# Patient Record
Sex: Female | Born: 1955 | Race: White | Hispanic: No | Marital: Married | Smoking: Never smoker
Health system: Southern US, Community
[De-identification: ages and names within clinical notes are randomized; demographics above are authoritative.]

## PROBLEM LIST (undated history)

## (undated) DIAGNOSIS — E039 Hypothyroidism, unspecified: Secondary | ICD-10-CM

## (undated) DIAGNOSIS — E538 Deficiency of other specified B group vitamins: Secondary | ICD-10-CM

## (undated) DIAGNOSIS — Z8719 Personal history of other diseases of the digestive system: Secondary | ICD-10-CM

## (undated) DIAGNOSIS — R430 Anosmia: Secondary | ICD-10-CM

## (undated) DIAGNOSIS — R519 Headache, unspecified: Secondary | ICD-10-CM

## (undated) DIAGNOSIS — I493 Ventricular premature depolarization: Secondary | ICD-10-CM

## (undated) DIAGNOSIS — R51 Headache: Secondary | ICD-10-CM

## (undated) HISTORY — DX: Ventricular premature depolarization: I49.3

## (undated) HISTORY — PX: TONSILLECTOMY: SUR1361

## (undated) HISTORY — DX: Deficiency of other specified B group vitamins: E53.8

## (undated) HISTORY — DX: Anosmia: R43.0

## (undated) HISTORY — PX: BREAST BIOPSY: SHX20

## (undated) HISTORY — PX: COLONOSCOPY: SHX174

## (undated) HISTORY — PX: BREAST CYST ASPIRATION: SHX578

---

## 1997-08-21 ENCOUNTER — Ambulatory Visit (HOSPITAL_COMMUNITY): Admission: RE | Admit: 1997-08-21 | Discharge: 1997-08-21 | Payer: Self-pay | Admitting: Obstetrics and Gynecology

## 1997-09-03 ENCOUNTER — Ambulatory Visit (HOSPITAL_COMMUNITY): Admission: RE | Admit: 1997-09-03 | Discharge: 1997-09-03 | Payer: Self-pay | Admitting: Obstetrics and Gynecology

## 1999-03-31 ENCOUNTER — Other Ambulatory Visit: Admission: RE | Admit: 1999-03-31 | Discharge: 1999-03-31 | Payer: Self-pay | Admitting: Obstetrics and Gynecology

## 2000-05-31 ENCOUNTER — Other Ambulatory Visit: Admission: RE | Admit: 2000-05-31 | Discharge: 2000-05-31 | Payer: Self-pay | Admitting: Obstetrics and Gynecology

## 2003-01-30 ENCOUNTER — Ambulatory Visit (HOSPITAL_COMMUNITY): Admission: RE | Admit: 2003-01-30 | Discharge: 2003-01-30 | Payer: Self-pay | Admitting: Gastroenterology

## 2003-05-12 ENCOUNTER — Other Ambulatory Visit: Admission: RE | Admit: 2003-05-12 | Discharge: 2003-05-12 | Payer: Self-pay | Admitting: Obstetrics and Gynecology

## 2004-10-28 ENCOUNTER — Encounter: Admission: RE | Admit: 2004-10-28 | Discharge: 2004-10-28 | Payer: Self-pay | Admitting: Obstetrics and Gynecology

## 2004-11-10 ENCOUNTER — Other Ambulatory Visit: Admission: RE | Admit: 2004-11-10 | Discharge: 2004-11-10 | Payer: Self-pay | Admitting: Obstetrics and Gynecology

## 2004-11-11 ENCOUNTER — Ambulatory Visit: Payer: Self-pay | Admitting: Hematology & Oncology

## 2005-01-25 ENCOUNTER — Ambulatory Visit: Payer: Self-pay | Admitting: Hematology & Oncology

## 2005-10-04 ENCOUNTER — Encounter: Admission: RE | Admit: 2005-10-04 | Discharge: 2005-11-25 | Payer: Self-pay | Admitting: Family Medicine

## 2007-01-25 ENCOUNTER — Ambulatory Visit (HOSPITAL_BASED_OUTPATIENT_CLINIC_OR_DEPARTMENT_OTHER): Admission: RE | Admit: 2007-01-25 | Discharge: 2007-01-25 | Payer: Self-pay | Admitting: Orthopedic Surgery

## 2007-03-06 ENCOUNTER — Encounter: Admission: RE | Admit: 2007-03-06 | Discharge: 2007-03-06 | Payer: Self-pay | Admitting: Obstetrics and Gynecology

## 2007-11-12 ENCOUNTER — Encounter: Admission: RE | Admit: 2007-11-12 | Discharge: 2007-11-12 | Payer: Self-pay | Admitting: Obstetrics and Gynecology

## 2007-11-15 ENCOUNTER — Encounter: Admission: RE | Admit: 2007-11-15 | Discharge: 2007-11-15 | Payer: Self-pay | Admitting: Obstetrics and Gynecology

## 2007-11-15 ENCOUNTER — Encounter (INDEPENDENT_AMBULATORY_CARE_PROVIDER_SITE_OTHER): Payer: Self-pay | Admitting: Diagnostic Radiology

## 2008-05-12 ENCOUNTER — Encounter: Admission: RE | Admit: 2008-05-12 | Discharge: 2008-05-12 | Payer: Self-pay | Admitting: Obstetrics and Gynecology

## 2009-09-30 ENCOUNTER — Encounter: Admission: RE | Admit: 2009-09-30 | Discharge: 2009-09-30 | Payer: Self-pay | Admitting: Obstetrics and Gynecology

## 2010-09-01 ENCOUNTER — Other Ambulatory Visit: Payer: Self-pay | Admitting: Dermatology

## 2010-11-30 NOTE — Op Note (Signed)
NAME:  Sherri Villarreal, Sherri Villarreal              ACCOUNT NO.:  [id number]   MEDICAL RECORD NO.:  [id number]          PATIENT TYPE:  AMB   LOCATION:  DSC                          FACILITY:  [facility]   PHYSICIAN:  Rodney A. Mortenson, M.D.DATE OF BIRTH:  03/17/1956   DATE OF PROCEDURE:  01/25/2007  DATE OF DISCHARGE:                               OPERATIVE REPORT   PREOPERATIVE DIAGNOSIS:  Pyarthrosis PIP joint left long finger.   POSTOPERATIVE DIAGNOSIS:  Pyarthrosis PIP joint left long finger.   OPERATION:  Arthrotomy PIP joint left long finger, irrigation and  drainage, and cultures PIP joint, left long finger.   SURGEON:  Lenard Galloway. Chaney Malling, M.D.   ANESTHESIA:  MAC.   PROCEDURE:  The patient was placed on the operating table in a supine  position.  A pneumatic tourniquet was placed about the left arm.  The  entire left upper extremity was prepped with DuraPrep and draped out in  the usual manner.  A digital nerve block was done as MAC anesthesia was  given.  The hand and arm was wrapped out and the Esmarch tourniquet was  elevated.  There was marked swelling about the PIP joint to the long  finger.  An incision was made over the dorsal radial side of the PIP  joint for about 8-9 mm.  The synovium was bulging out into the wound.  The dorsal capsule synovium was incised and cloudy fluid was seen in the  joint.  Aerobic and anaerobic cultures were taken.  Expression of the  PIP joint produced further yellow cloudy fluid.  The joint was then  irrigated with copious amounts of saline solution.  A Penrose type drain  was placed in the joint.  The wound was left open.  Sterile dressings  were applied.  The patient returned to the recovery room in excellent  condition.  The patient was placed on IV Keflex after cultures were  obtained.  Technically, this procedure went extremely well.   FOLLOW-UP CARE:  1. To my office on Monday.  2. I will change the dressing and pull the Penrose on Monday.  3.  Keflex 500 mg q.i.d. pending culture results.           ______________________________  Lenard Galloway Chaney Malling, M.D.     RAM/MEDQ  D:  01/25/2007  T:  01/26/2007  Job:  [id number]

## 2010-12-03 NOTE — Op Note (Signed)
   NAME:  Sherri Villarreal, Sherri Villarreal                        ACCOUNT NO.:  [id number]   MEDICAL RECORD NO.:  [id number]                   PATIENT TYPE:  AMB   LOCATION:  ENDO                                 FACILITY:  [facility]   PHYSICIAN:  Graylin Shiver, M.D.                DATE OF BIRTH:  04/03/1956   DATE OF PROCEDURE:  01/30/2003  DATE OF DISCHARGE:                                 OPERATIVE REPORT   PROCEDURE:  Colonoscopy.   INDICATION FOR PROCEDURE:  Iron-deficiency anemia, family history of colon  polyps.   Informed consent was obtained after explanation of risks of bleeding,  infection, and perforation.   PREMEDICATION:  Fentanyl 100 mcg IV, Versed 7 mg IV.   PROCEDURE:  With the patient in the left lateral decubitus position, a  rectal exam was performed and no masses were felt.  The Olympus colonoscope  was inserted into the rectum and advanced around the colon to the cecum.  Cecal landmarks were identified.  The cecum and ascending colon were normal.  The transverse colon was normal.  The descending colon and sigmoid and  rectum were normal.  She tolerated the procedure well without complications.   IMPRESSION:  Normal colonoscopy to the cecum.                                               Graylin Shiver, M.D.    SFG/MEDQ  D:  01/30/2003  T:  01/30/2003  Job:  [id number]   cc:   Deboraha Sprang at [facility], FNP

## 2011-05-03 LAB — POCT HEMOGLOBIN-HEMACUE: Hemoglobin: 13.4

## 2011-05-03 LAB — CULTURE, ROUTINE-ABSCESS: Culture: NO GROWTH

## 2011-05-03 LAB — ANAEROBIC CULTURE

## 2011-10-04 ENCOUNTER — Other Ambulatory Visit: Payer: Self-pay | Admitting: Dermatology

## 2012-06-04 ENCOUNTER — Other Ambulatory Visit: Payer: Self-pay | Admitting: Obstetrics and Gynecology

## 2012-06-12 ENCOUNTER — Other Ambulatory Visit: Payer: Self-pay | Admitting: Obstetrics and Gynecology

## 2012-06-12 DIAGNOSIS — R928 Other abnormal and inconclusive findings on diagnostic imaging of breast: Secondary | ICD-10-CM

## 2012-06-20 ENCOUNTER — Ambulatory Visit
Admission: RE | Admit: 2012-06-20 | Discharge: 2012-06-20 | Disposition: A | Discharge status: Home / Self Care | Payer: 59 | Source: Ambulatory Visit | Attending: Obstetrics and Gynecology | Admitting: Obstetrics and Gynecology

## 2012-06-20 DIAGNOSIS — R928 Other abnormal and inconclusive findings on diagnostic imaging of breast: Secondary | ICD-10-CM

## 2013-03-06 ENCOUNTER — Other Ambulatory Visit: Payer: Self-pay | Admitting: Dermatology

## 2013-08-29 ENCOUNTER — Other Ambulatory Visit: Payer: Self-pay

## 2013-08-29 DIAGNOSIS — Z1231 Encounter for screening mammogram for malignant neoplasm of breast: Secondary | ICD-10-CM

## 2013-09-05 ENCOUNTER — Other Ambulatory Visit: Payer: Self-pay | Admitting: Obstetrics and Gynecology

## 2013-09-16 ENCOUNTER — Ambulatory Visit
Admission: RE | Admit: 2013-09-16 | Discharge: 2013-09-16 | Disposition: A | Discharge status: Home / Self Care | Payer: 59 | Source: Ambulatory Visit

## 2013-09-16 DIAGNOSIS — Z1231 Encounter for screening mammogram for malignant neoplasm of breast: Secondary | ICD-10-CM

## 2013-09-17 ENCOUNTER — Other Ambulatory Visit: Payer: Self-pay | Admitting: Obstetrics and Gynecology

## 2013-09-17 DIAGNOSIS — R928 Other abnormal and inconclusive findings on diagnostic imaging of breast: Secondary | ICD-10-CM

## 2013-09-20 ENCOUNTER — Ambulatory Visit
Admission: RE | Admit: 2013-09-20 | Discharge: 2013-09-20 | Disposition: A | Discharge status: Home / Self Care | Payer: 59 | Source: Ambulatory Visit | Attending: Obstetrics and Gynecology | Admitting: Obstetrics and Gynecology

## 2013-09-20 DIAGNOSIS — R928 Other abnormal and inconclusive findings on diagnostic imaging of breast: Secondary | ICD-10-CM

## 2013-11-01 ENCOUNTER — Telehealth: Payer: Self-pay | Admitting: *Deleted

## 2013-11-01 NOTE — Telephone Encounter (Signed)
Patient called for metoprolol refill, she is out. She wants it sent to [facility] [address]. Thanks, [place]

## 2013-11-04 ENCOUNTER — Telehealth: Payer: Self-pay | Admitting: *Deleted

## 2013-11-04 MED ORDER — METOPROLOL SUCCINATE ER 50 MG PO TB24: 50.0000 mg | ORAL_TABLET | Freq: Every day | ORAL | Status: DC

## 2013-11-04 NOTE — Telephone Encounter (Signed)
Rx filled

## 2013-11-04 NOTE — Telephone Encounter (Signed)
Patient requests metoprolol refill to be sent to [facility] on [address]. She states that she is now out of the medication. Thanks, MI

## 2013-12-30 ENCOUNTER — Other Ambulatory Visit: Payer: Self-pay | Admitting: Family Medicine

## 2013-12-30 ENCOUNTER — Ambulatory Visit
Admission: RE | Admit: 2013-12-30 | Discharge: 2013-12-30 | Disposition: A | Discharge status: Home / Self Care | Payer: 59 | Source: Ambulatory Visit | Attending: Family Medicine | Admitting: Family Medicine

## 2013-12-30 DIAGNOSIS — M545 Low back pain, unspecified: Secondary | ICD-10-CM

## 2014-03-03 ENCOUNTER — Encounter: Payer: Self-pay | Admitting: *Deleted

## 2014-03-12 ENCOUNTER — Encounter: Payer: Self-pay | Admitting: Cardiology

## 2014-03-12 ENCOUNTER — Ambulatory Visit (INDEPENDENT_AMBULATORY_CARE_PROVIDER_SITE_OTHER): Payer: 59 | Admitting: Cardiology

## 2014-03-12 VITALS — BP 122/76 | HR 65 | Ht 68.0 in | Wt 160.0 lb

## 2014-03-12 DIAGNOSIS — R002 Palpitations: Secondary | ICD-10-CM

## 2014-03-12 DIAGNOSIS — I4949 Other premature depolarization: Secondary | ICD-10-CM

## 2014-03-12 DIAGNOSIS — I493 Ventricular premature depolarization: Secondary | ICD-10-CM | POA: Insufficient documentation

## 2014-03-12 NOTE — Progress Notes (Signed)
[place]. [address] [place], [place]  [address] Phone: [phone] Fax:  [phone]  Date:  03/12/2014   ID:  Sherri Villarreal, DOB [birth year], MRN [id number]  PCP:  No primary provider on file.   History of Present Illness: Sherri Villarreal is a 58 y.o. female here for follow up of palpitations she has had a history of hypothyroidism. She's noticed skipping of the be on a daily basis. TSH was normal. I started metoprolol 25 mg extended release once a day. This was after her Holter monitor demonstrated 7000 PVCs. Her echocardiogram demonstrated normal ejection fraction with mild MR/TR.she is still stating that she has not had any relief from her symptoms with the metoprolol 25 mg. I increased it to 50 mg a day. I discussed with her that since these PVCs are symptomatic, we could consider antiarrhythmic therapy. She denies any high-risk symptoms such as syncope, chest pain.   Currently she is doing quite well. occasionally she will feel a fluttering sensation but this is quite rare. She did ask if she would need to be on the metoprolol lifelong. We discussed on 02/24/11 that if she is having a period time where the palpitations are decreased significantly, she can try to taper off of the metoprolol as we discussed. I asked her to call me if she does do this for update.  On 09/11/12, she did call for an update on increasing palpitations. She requested that we increase her metoprolol. I obliged and increased to 50 mg. Today she is here for followup.Feels better. Asked about correlation with Vit D?  03/08/13 - Doing well. Rare daily palps. No syncope. On 50mg  Metop.  03/12/14 - Can't remember last palpitations. B12 low, Vit D. doing well. Her father had mitral valve repair by Dr. Roxy Manns on 03/11/14.    Wt Readings from Last 3 Encounters:  03/12/14 160 lb (72.576 kg)     Past Medical History  Diagnosis Date  . B12 deficiency   . Anosmia   . Frequent PVCs     No past surgical  history on file.  Current Outpatient Prescriptions  Medication Sig Dispense Refill  . Calcium Carb-Cholecalciferol (CALCIUM + D3) 600-200 MG-UNIT TABS Take by mouth.      . Cyanocobalamin (VITAMIN B-12) 2000 MCG TBCR Take 2,000 mcg by mouth daily.      Marland Kitchen levothyroxine (SYNTHROID) 150 MCG tablet Take 150 mcg by mouth daily before breakfast.      . metoprolol succinate (TOPROL-XL) 50 MG 24 hr tablet Take 1 tablet (50 mg total) by mouth daily. Take with or immediately following a meal.  30 tablet  6  . Omega-3 Fatty Acids (FISH OIL) 1000 MG CAPS Take 1,000 mg by mouth daily.       No current facility-administered medications for this visit.    Allergies:   No Known Allergies  Social History:  The patient  reports that she has never smoked. She does not have any smokeless tobacco history on file. She reports that she drinks alcohol. She reports that she does not use illicit drugs.   No family history on file.  ROS:  Please see the history of present illness.   Denies any fevers, chills, orthopnea, PND, syncope   All other systems reviewed and negative.   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 Palpitations/PVCs-excellent control with metoprolol. No symptoms. Continue. EKG excellent. 2. One year followup.  Signed, Candee Furbish, MD [facility]  03/12/2014 9:38 AM

## 2014-03-12 NOTE — Patient Instructions (Signed)
The current medical regimen is effective;  continue present plan and medications.  Follow up in 1 year with Dr Skains.  You will receive a letter in the mail 2 months before you are due.  Please call us when you receive this letter to schedule your follow up appointment.  

## 2014-03-26 ENCOUNTER — Encounter: Payer: Self-pay | Admitting: Cardiology

## 2014-06-06 ENCOUNTER — Other Ambulatory Visit: Payer: Self-pay

## 2014-06-06 MED ORDER — METOPROLOL SUCCINATE ER 50 MG PO TB24: 50.0000 mg | ORAL_TABLET | Freq: Every day | ORAL | Status: DC

## 2014-10-08 ENCOUNTER — Other Ambulatory Visit: Payer: Self-pay

## 2014-10-08 DIAGNOSIS — Z1231 Encounter for screening mammogram for malignant neoplasm of breast: Secondary | ICD-10-CM

## 2014-10-09 ENCOUNTER — Ambulatory Visit
Admission: RE | Admit: 2014-10-09 | Discharge: 2014-10-09 | Disposition: A | Discharge status: Home / Self Care | Payer: 59 | Source: Ambulatory Visit

## 2014-10-09 DIAGNOSIS — Z1231 Encounter for screening mammogram for malignant neoplasm of breast: Secondary | ICD-10-CM

## 2014-10-21 ENCOUNTER — Other Ambulatory Visit: Payer: Self-pay | Admitting: Obstetrics and Gynecology

## 2014-10-22 LAB — CYTOLOGY - PAP

## 2015-01-23 ENCOUNTER — Other Ambulatory Visit: Payer: Self-pay | Admitting: Cardiology

## 2015-03-12 ENCOUNTER — Ambulatory Visit (INDEPENDENT_AMBULATORY_CARE_PROVIDER_SITE_OTHER): Payer: 59 | Admitting: Cardiology

## 2015-03-12 ENCOUNTER — Encounter: Payer: Self-pay | Admitting: Cardiology

## 2015-03-12 VITALS — BP 120/62 | HR 61 | Ht 68.0 in | Wt 163.0 lb

## 2015-03-12 DIAGNOSIS — R002 Palpitations: Secondary | ICD-10-CM | POA: Diagnosis not present

## 2015-03-12 DIAGNOSIS — I493 Ventricular premature depolarization: Secondary | ICD-10-CM

## 2015-03-12 MED ORDER — METOPROLOL SUCCINATE ER 50 MG PO TB24: ORAL_TABLET | ORAL | Status: DC

## 2015-03-12 NOTE — Patient Instructions (Signed)
Medication Instructions:  Your physician recommends that you continue on your current medications as directed. Please refer to the Current Medication list given to you today.  A refill for Metoprolol has been sent to your pharmacy   Labwork: None ordered  Testing/Procedures: None ordered  Follow-Up: Your physician wants you to follow-up in: 1 year with Dr.Skains You will receive a reminder letter in the mail two months in advance. If you don't receive a letter, please call our office to schedule the follow-up appointment.   Any Other Special Instructions Will Be Listed Below (If Applicable).

## 2015-03-12 NOTE — Progress Notes (Signed)
[place]. [address] [place], [place]  [address] Phone: [phone] Fax:  [phone]  Date:  03/12/2015   ID:  Sherri Villarreal, DOB [birth year], MRN [id number]  PCP:  Gerrit Heck, MD   History of Present Illness: Sherri Villarreal is a 59 y.o. female here for follow up of palpitations she has had a history of hypothyroidism. She's noticed skipping of the be on a daily basis. TSH was normal. I started metoprolol 25 mg extended release once a day. This was after her Holter monitor demonstrated 7000 PVCs. Her echocardiogram demonstrated normal ejection fraction with mild MR/TR.she is still stating that she has not had any relief from her symptoms with the metoprolol 25 mg. I increased it to 50 mg a day. I discussed with her that since these PVCs are symptomatic, we could consider antiarrhythmic therapy. She denies any high-risk symptoms such as syncope, chest pain.   Currently she is doing quite well. occasionally she will feel a fluttering sensation but this is quite rare. She did ask if she would need to be on the metoprolol lifelong. We discussed on 02/24/11 that if she is having a period time where the palpitations are decreased significantly, she can try to taper off of the metoprolol as we discussed. I asked her to call me if she does do this for update.  On 09/11/12, she did call for an update on increasing palpitations. She requested that we increase her metoprolol. I obliged and increased to 50 mg. Today she is here for followup.Feels better. Asked about correlation with Vit D?  03/08/13 - Doing well. Rare daily palps. No syncope. On 50mg  Metop.  03/12/14 - Can't remember last palpitations. B12 low, Vit D. doing well. Her father had mitral valve repair by Dr. Roxy Manns on 03/11/14.  03/12/15 - Doing really well. No recent palpitations.    Wt Readings from Last 3 Encounters:  03/12/15 163 lb (73.936 kg)  03/12/14 160 lb (72.576 kg)     Past Medical History  Diagnosis  Date  . B12 deficiency   . Anosmia   . Frequent PVCs     No past surgical history on file.  Current Outpatient Prescriptions  Medication Sig Dispense Refill  . Calcium Carb-Cholecalciferol (CALCIUM + D3) 600-200 MG-UNIT TABS Take by mouth.    . Cyanocobalamin (VITAMIN B-12) 2000 MCG TBCR Take 2,000 mcg by mouth daily.    . metoprolol succinate (TOPROL-XL) 50 MG 24 hr tablet TAKE 1 TABLET (50 MG TOTAL) BY MOUTH DAILY. TAKE WITH OR IMMEDIATELY FOLLOWING A MEAL. 30 tablet 1  . Omega-3 Fatty Acids (FISH OIL) 1000 MG CAPS Take 1,000 mg by mouth daily.    Marland Kitchen SYNTHROID 137 MCG tablet Take 137 mcg by mouth daily.  0   No current facility-administered medications for this visit.    Allergies:   No Known Allergies  Social History:  The patient  reports that she has never smoked. She does not have any smokeless tobacco history on file. She reports that she drinks alcohol. She reports that she does not use illicit drugs.   No family history on file. Father had MI and WPW. [facility] Dr. Roxy Manns. Mother with AFIB  ROS:  Please see the history of present illness.   Denies any fevers, chills, orthopnea, PND, syncope   All other systems reviewed and negative.   PHYSICAL EXAM: VS:  BP 120/62 mmHg  Pulse 61  Ht 5\' 8"  (1.727 m)  Wt 163  lb (73.936 kg)  BMI 24.79 kg/m2 Well nourished, well developed, in no acute distress HEENT: normal, [place]/AT, EOMI Neck: no JVD, normal carotid upstroke, no bruit Cardiac:  normal S1, S2; RRR; no murmur Lungs:  clear to auscultation bilaterally, no wheezing, rhonchi or rales Abd: soft, nontender, no hepatomegaly, no bruits Ext: no edema, 2+ distal pulses Skin: warm and dry GU: deferred Neuro: no focal abnormalities noted, AAO x 3  EKG:  Today 03/12/15 - NSR no changes. Personally viewed. 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 Palpitations/PVCs-excellent control with metoprolol. No symptoms. Continue. EKG excellent.  Had best year yet. 2. One year followup.  Signed, Candee Furbish, MD [facility]  03/12/2015 8:17 AM

## 2015-09-11 ENCOUNTER — Other Ambulatory Visit: Payer: Self-pay

## 2015-09-11 DIAGNOSIS — Z1231 Encounter for screening mammogram for malignant neoplasm of breast: Secondary | ICD-10-CM

## 2015-10-12 ENCOUNTER — Ambulatory Visit
Admission: RE | Admit: 2015-10-12 | Discharge: 2015-10-12 | Disposition: A | Discharge status: Home / Self Care | Payer: 59 | Source: Ambulatory Visit

## 2015-10-12 DIAGNOSIS — Z1231 Encounter for screening mammogram for malignant neoplasm of breast: Secondary | ICD-10-CM

## 2016-03-02 ENCOUNTER — Other Ambulatory Visit (HOSPITAL_COMMUNITY): Payer: Self-pay | Admitting: Urology

## 2016-03-02 DIAGNOSIS — N361 Urethral diverticulum: Secondary | ICD-10-CM

## 2016-03-05 ENCOUNTER — Ambulatory Visit (HOSPITAL_COMMUNITY)
Admission: RE | Admit: 2016-03-05 | Discharge: 2016-03-05 | Disposition: A | Discharge status: Home / Self Care | Payer: 59 | Source: Ambulatory Visit | Attending: Urology | Admitting: Urology

## 2016-03-05 DIAGNOSIS — N361 Urethral diverticulum: Secondary | ICD-10-CM

## 2016-03-05 DIAGNOSIS — N898 Other specified noninflammatory disorders of vagina: Secondary | ICD-10-CM | POA: Diagnosis not present

## 2016-03-05 LAB — POCT I-STAT CREATININE: Creatinine, Ser: 0.7 mg/dL (ref 0.44–1.00)

## 2016-03-05 MED ORDER — GADOBENATE DIMEGLUMINE 529 MG/ML IV SOLN
15.0000 mL | Freq: Once | INTRAVENOUS | Status: AC | PRN
  Administered 2016-03-05: 15 mL via INTRAVENOUS

## 2016-03-07 ENCOUNTER — Ambulatory Visit (HOSPITAL_COMMUNITY): Payer: 59

## 2016-04-04 ENCOUNTER — Other Ambulatory Visit: Payer: Self-pay | Admitting: Cardiology

## 2016-05-16 ENCOUNTER — Other Ambulatory Visit: Payer: Self-pay | Admitting: Cardiology

## 2016-05-30 ENCOUNTER — Other Ambulatory Visit: Payer: Self-pay | Admitting: Urology

## 2016-06-14 ENCOUNTER — Other Ambulatory Visit: Payer: Self-pay | Admitting: Cardiology

## 2016-07-22 DIAGNOSIS — S0501XA Injury of conjunctiva and corneal abrasion without foreign body, right eye, initial encounter: Secondary | ICD-10-CM | POA: Diagnosis not present

## 2016-07-25 NOTE — Progress Notes (Signed)
LOV DR SKAINS CARDIO 03-12-15 EPIC

## 2016-07-25 NOTE — Patient Instructions (Signed)
[place]  07/25/2016   Your procedure is scheduled on: 07-28-16  Report to [facility] Main  Entrance take [facility]  elevators to 3rd floor to  [place] at 530 AM.  Call this number if you have problems the morning of surgery [phone]   Remember: ONLY 1 PERSON MAY GO WITH YOU TO SHORT STAY TO GET  READY MORNING OF [place].  Do not eat food or drink liquids :After Midnight.     Take these medicines the morning of surgery with A SIP OF WATER: SYNTHROID              You may not have any metal on your body including hair pins and              piercings  Do not wear jewelry, make-up, lotions, powders or perfumes, deodorant             Do not wear nail polish.  Do not shave  48 hours prior to surgery.              Men may shave face and neck.   Do not bring valuables to the hospital. [place].  Contacts, dentures or bridgework may not be worn into surgery.  Leave suitcase in the car. After surgery it may be brought to your room.                  Please read over the following fact sheets you were given: _____________________________________________________________________             [facility] - Preparing for Surgery Before surgery, you can play an important role.  Because skin is not sterile, your skin needs to be as free of germs as possible.  You can reduce the number of germs on your skin by washing with CHG (chlorahexidine gluconate) soap before surgery.  CHG is an antiseptic cleaner which kills germs and bonds with the skin to continue killing germs even after washing. Please DO NOT use if you have an allergy to CHG or antibacterial soaps.  If your skin becomes reddened/irritated stop using the CHG and inform your nurse when you arrive at Short Stay. Do not shave (including legs and underarms) for at least 48 hours prior to the first CHG shower.  You may shave your face/neck. Please  follow these instructions carefully:  1.  Shower with CHG Soap the night before surgery and the  morning of Surgery.  2.  If you choose to wash your hair, wash your hair first as usual with your  normal  shampoo.  3.  After you shampoo, rinse your hair and body thoroughly to remove the  shampoo.                           4.  Use CHG as you would any other liquid soap.  You can apply chg directly  to the skin and wash                       Gently with a scrungie or clean washcloth.  5.  Apply the CHG Soap to your body ONLY FROM THE NECK DOWN.   Do not use on face/ open  Wound or open sores. Avoid contact with eyes, ears mouth and genitals (private parts).                       Wash face,  Genitals (private parts) with your normal soap.             6.  Wash thoroughly, paying special attention to the area where your surgery  will be performed.  7.  Thoroughly rinse your body with warm water from the neck down.  8.  DO NOT shower/wash with your normal soap after using and rinsing off  the CHG Soap.                9.  Pat yourself dry with a clean towel.            10.  Wear clean pajamas.            11.  Place clean sheets on your bed the night of your first shower and do not  sleep with pets. Day of Surgery : Do not apply any lotions/deodorants the morning of surgery.  Please wear clean clothes to the hospital/surgery center.  FAILURE TO FOLLOW THESE INSTRUCTIONS MAY RESULT IN THE CANCELLATION OF YOUR SURGERY PATIENT SIGNATURE_________________________________  NURSE SIGNATURE__________________________________  ________________________________________________________________________

## 2016-07-27 ENCOUNTER — Encounter (HOSPITAL_COMMUNITY): Payer: Self-pay

## 2016-07-27 ENCOUNTER — Encounter (HOSPITAL_COMMUNITY)
Admission: RE | Admit: 2016-07-27 | Discharge: 2016-07-27 | Disposition: A | Discharge status: Home / Self Care | Payer: 59 | Source: Ambulatory Visit | Attending: Urology | Admitting: Urology

## 2016-07-27 DIAGNOSIS — N361 Urethral diverticulum: Secondary | ICD-10-CM | POA: Diagnosis not present

## 2016-07-27 DIAGNOSIS — Z79899 Other long term (current) drug therapy: Secondary | ICD-10-CM | POA: Diagnosis not present

## 2016-07-27 DIAGNOSIS — N362 Urethral caruncle: Secondary | ICD-10-CM | POA: Diagnosis not present

## 2016-07-27 DIAGNOSIS — N368 Other specified disorders of urethra: Secondary | ICD-10-CM | POA: Diagnosis not present

## 2016-07-27 DIAGNOSIS — Z87440 Personal history of urinary (tract) infections: Secondary | ICD-10-CM | POA: Diagnosis not present

## 2016-07-27 DIAGNOSIS — Q524 Other congenital malformations of vagina: Secondary | ICD-10-CM | POA: Diagnosis not present

## 2016-07-27 DIAGNOSIS — E039 Hypothyroidism, unspecified: Secondary | ICD-10-CM | POA: Diagnosis not present

## 2016-07-27 HISTORY — DX: Headache, unspecified: R51.9

## 2016-07-27 HISTORY — DX: Personal history of other diseases of the digestive system: Z87.19

## 2016-07-27 HISTORY — DX: Hypothyroidism, unspecified: E03.9

## 2016-07-27 HISTORY — DX: Headache: R51

## 2016-07-27 LAB — CBC
HEMATOCRIT: 38.5 % (ref 36.0–46.0)
Hemoglobin: 12.3 g/dL (ref 12.0–15.0)
MCH: 28.7 pg (ref 26.0–34.0)
MCHC: 31.9 g/dL (ref 30.0–36.0)
MCV: 89.7 fL (ref 78.0–100.0)
Platelets: 210 10*3/uL (ref 150–400)
RBC: 4.29 MIL/uL (ref 3.87–5.11)
RDW: 13.2 % (ref 11.5–15.5)
WBC: 4.9 10*3/uL (ref 4.0–10.5)

## 2016-07-27 LAB — BASIC METABOLIC PANEL
Anion gap: 5 (ref 5–15)
BUN: 26 mg/dL — AB (ref 6–20)
CHLORIDE: 105 mmol/L (ref 101–111)
CO2: 29 mmol/L (ref 22–32)
Calcium: 9 mg/dL (ref 8.9–10.3)
Creatinine, Ser: 0.63 mg/dL (ref 0.44–1.00)
GFR calc Af Amer: >60 mL/min (ref >60)
GFR calc non Af Amer: >60 mL/min (ref >60)
GLUCOSE: 94 mg/dL (ref 65–99)
POTASSIUM: 4.7 mmol/L (ref 3.5–5.1)
Sodium: 139 mmol/L (ref 135–145)

## 2016-07-27 NOTE — Progress Notes (Signed)
bmet results sent to dr Gaynelle Arabian by epic fax

## 2016-07-27 NOTE — Anesthesia Preprocedure Evaluation (Addendum)
Anesthesia Evaluation  Patient identified by MRN, date of birth, ID band Patient awake    Reviewed: Allergy & Precautions, H&P , Patient's Chart, lab work & pertinent test results, reviewed documented beta blocker date and time   History of Anesthesia Complications (+) history of anesthetic complications  Airway Mallampati: II  TM Distance: >3 FB Neck ROM: full    Dental no notable dental hx.    Pulmonary    Pulmonary exam normal breath sounds clear to auscultation       Cardiovascular  Rhythm:regular Rate:Normal     Neuro/Psych    GI/Hepatic   Endo/Other    Renal/GU      Musculoskeletal   Abdominal   Peds  Hematology   Anesthesia Other Findings   Reproductive/Obstetrics                            Anesthesia Physical Anesthesia Plan  ASA: II  Anesthesia Plan: General   Post-op Pain Management:    Induction: Intravenous  Airway Management Planned: LMA  Additional Equipment:   Intra-op Plan:   Post-operative Plan:   Informed Consent: I have reviewed the patients History and Physical, chart, labs and discussed the procedure including the risks, benefits and alternatives for the proposed anesthesia with the patient or authorized representative who has indicated his/her understanding and acceptance.   Dental Advisory Given and Dental advisory given  Plan Discussed with: CRNA and Surgeon  Anesthesia Plan Comments: (Discussed GA with LMA, possible sore throat, potential need to switch to ETT, N/V, pulmonary aspiration. Questions answered. )        Anesthesia Quick Evaluation

## 2016-07-27 NOTE — H&P (Signed)
Office Visit Report     04/12/2016   --------------------------------------------------------------------------------   Sherri Villarreal  MRN: [id number]  PRIMARY CARE:  Sherri Villarreal. Sherri Serene, NP  DOB: 1955-12-04, 61 year old Female  REFERRING:    SSN: [id number]  PROVIDER:  Carolan Clines, M.D.    LOCATION:  [facility] 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  HPI: Sherri Villarreal is a 61 year-old female established patient who is here for urethral diverticulum.  Her problem was diagnosed approximately 02/16/2011. She has had it for 5 years. Her symptoms have been stable over the last year. She does not have a history of urinary infections.   She does not have burning or discomfort when she urinates. She has not had discharge. She does not have pain with intercourse. She did not see the blood in her unine.   no infection, no pain, 4 UTI in 2015, none in 2016.   F/u to review MRI results.     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  Light Drinker.  Drinks 1 caffeinated drink per day. 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    REVIEW OF SYSTEMS:    GU Review Female:   Patient denies frequent urination, hard to postpone urination, burning /pain with urination, get up at night to urinate, leakage of urine, stream starts and stops, trouble starting your stream, have to strain to urinate, and currently pregnant.   Gastrointestinal (Upper):   Patient denies nausea, vomiting, and indigestion/ heartburn.  Gastrointestinal (Lower):   Patient denies diarrhea and constipation.  Constitutional:   Patient denies fever, night sweats, weight loss, and fatigue.  Skin:   Patient denies skin rash/ lesion and itching.  Eyes:   Patient denies blurred vision and double vision.  Ears/ Nose/ Throat:   Patient denies sore throat and sinus problems.  Hematologic/Lymphatic:   Patient denies swollen glands and easy bruising.  Cardiovascular:   Patient denies leg swelling and chest pains.  Respiratory:   Patient denies shortness of breath and cough.  Endocrine:   Patient denies excessive thirst.  Musculoskeletal:   Patient denies back pain and joint pain.  Neurological:   Patient denies headaches and dizziness.  Psychologic:   Patient denies depression and anxiety.   VITAL SIGNS:      04/12/2016 04:36 PM  BP 120/71 mmHg  Pulse 57 /min  Temperature 98.4 F / 37 C   GU PHYSICAL EXAMINATION:    Digital Rectal Exam: Normal sphincter tone. No rectal mass.  External Genitalia: No hirsutism, no rash, no scarring, no cyst, no erythematous lesion, no papular lesion, no blanched lesion, no warty lesion. No edema.  Urethral Meatus: Normal size. Normal position. No discharge.  Urethra: No tenderness, no mass, no scarring. No hypermobility. No leakage.  Bladder: Normal to palpation, no tenderness, no mass, normal size.  Vagina: No atrophy, no stenosis. No rectocele. No cystocele. No enterocele. Large proximal urethral diverticulum, 4 cm. non-tender. No puss expressed.   Cervix: No  inflammation, no discharge, no lesion, no tenderness, no wart.  Uterus: Normal size. Normal consistency. Normal position. No mobility. No descent.  Adnexa / Parametria: No tenderness. No adnexal mass. Normal left ovary. Normal right ovary.  Anus and Perineum: No hemorrhoids. No anal stenosis. No rectal fissure, no anal fissure. No edema, no dimple, no  perineal tenderness, no anal tenderness.   MULTI-SYSTEM PHYSICAL EXAMINATION:    Constitutional: Well-nourished. No physical deformities. Normally developed. Good grooming.  Neck: Neck symmetrical, not swollen. Normal tracheal position.  Respiratory: No labored breathing, no use of accessory muscles.   Cardiovascular: Normal temperature, normal extremity pulses, no swelling, no varicosities.  Lymphatic: No enlargement of neck, axillae, groin.  Skin: No paleness, no jaundice, no cyanosis. No lesion, no ulcer, no rash.  Neurologic / Psychiatric: Oriented to time, oriented to place, oriented to person. No depression, no anxiety, no agitation.  Gastrointestinal: No mass, no tenderness, no rigidity, non obese abdomen.  Eyes: Normal conjunctivae. Normal eyelids.  Ears, Nose, Mouth, and Throat: Left ear no scars, no lesions, no masses. Right ear no scars, no lesions, no masses. Nose no scars, no lesions, no masses. Normal hearing. Normal lips.  Musculoskeletal: Normal gait and station of head and neck.     PAST DATA REVIEWED:  Source Of History:  Patient  Records Review:   Previous Patient Records  Urine Test Review:   Urinalysis  X-Ray Review: MRI Pelvis: Reviewed Films. Reviewed Report. Discussed With Patient.     04/12/16 03/02/16  Urinalysis  Urine Appearance Clear    Urine Specimen Voided    Urine Color Yellow    Urine Glucose Neg    Urine Bilirubin Neg    Urine Ketones Neg    Urine Specific Gravity 1.020    Urine Blood Neg    Urine pH 5.0    Urine Protein Neg    Urine Urobilinogen 0.2    Urine Nitrites Neg    Urine Leukocyte Esterase 1+    Urine WBC/hpf 6-10/hpf    Urine RBC/hpf 0-2/hpf  0-2/hpf   Urine Epithelial Cells 6-10/hpf  0-5/hpf   Urine Bacteria Moderate (26-50/hpf)  NS (Not Seen)   Urine Mucous Not Present  Present   Urine Yeast NS (Not Seen)  NS (Not Seen)   Urine Trichomonas Not Present  Not Present   Urine Cystals NS (Not Seen)  NS (Not Seen)   Urine Casts NS  (Not Seen)  NS (Not Seen)   Urine Sperm Not Present  Not Present    PROCEDURES:          Urinalysis w/Scope - 81001 Dipstick Dipstick Cont'd Micro  Specimen: Voided Bilirubin: Neg WBC/hpf: 6-10/hpf  Color: Yellow Ketones: Neg RBC/hpf: 0-2/hpf  Appearance: Clear Blood: Neg Bacteria: Moderate (26-50/hpf)  Specific Gravity: 1.020 Protein: Neg Cystals: NS (Not Seen)  pH: 5.0 Urobilinogen: 0.2 Casts: NS (Not Seen)  Glucose: Neg Nitrites: Neg Trichomonas: Not Present    Leukocyte Esterase: 1+ Mucous: Not Present      Epithelial Cells: 6-10/hpf      Yeast: NS (Not Seen)      Sperm: Not Present    ASSESSMENT:      ICD-10 Details  1 NON-GU:   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 Lavonne Chick for evaluation. Periurethral mass. It is felt to have a possible urethral diverticulum 2015. She had 4 urinary tract infections  in 2016, and vaginal examination by Dr. Helane Rima shows the vaginal mass has increased in size.  physical examination shows a 4 cm midline cystic mass at the bladder neck level. MRI showed the mass to be a 4 cm proteinaceous or hemorrhagic cyst in the upper vagina, above the level of the pubic symphysis, favoring a Gartner's duct cyst. There is no communication with the urethra, and therefore, not considered to be a diverticulum.   I discussed this with the patient, we'll plan for reversal of the diverticulum under general anesthesia as an outpatient after the upcoming holidays. I'll as Dr. McDiarmid to assist.      PLAN:           Orders Labs Urine Culture and Sensitivity          Document Letter(s):  Created for Patient: Clinical Summary          The information contained in this medical record document is considered private and confidential patient information. This information can only be used for the medical diagnosis and/or medical services that are being provided by the  patient's selected caregivers. This information can only be distributed outside of the patient's care if the patient agrees and signs waivers of authorization for this information to be sent to an outside source or route.

## 2016-07-28 ENCOUNTER — Encounter (HOSPITAL_COMMUNITY)
Admission: AD | Disposition: A | Discharge status: Home / Self Care | Payer: Self-pay | Source: Ambulatory Visit | Attending: Urology

## 2016-07-28 ENCOUNTER — Ambulatory Visit (HOSPITAL_COMMUNITY): Payer: 59 | Admitting: Anesthesiology

## 2016-07-28 ENCOUNTER — Encounter (HOSPITAL_COMMUNITY): Payer: Self-pay | Admitting: *Deleted

## 2016-07-28 ENCOUNTER — Observation Stay (HOSPITAL_COMMUNITY)
Admission: AD | Admit: 2016-07-28 | Discharge: 2016-07-28 | DRG: 672 | Disposition: A | Discharge status: Home / Self Care | Payer: 59 | Source: Ambulatory Visit | Attending: Urology | Admitting: Urology

## 2016-07-28 DIAGNOSIS — E039 Hypothyroidism, unspecified: Secondary | ICD-10-CM | POA: Insufficient documentation

## 2016-07-28 DIAGNOSIS — L089 Local infection of the skin and subcutaneous tissue, unspecified: Secondary | ICD-10-CM | POA: Diagnosis not present

## 2016-07-28 DIAGNOSIS — N362 Urethral caruncle: Secondary | ICD-10-CM | POA: Insufficient documentation

## 2016-07-28 DIAGNOSIS — N368 Other specified disorders of urethra: Principal | ICD-10-CM | POA: Insufficient documentation

## 2016-07-28 DIAGNOSIS — Z79899 Other long term (current) drug therapy: Secondary | ICD-10-CM | POA: Insufficient documentation

## 2016-07-28 DIAGNOSIS — Q524 Other congenital malformations of vagina: Secondary | ICD-10-CM

## 2016-07-28 DIAGNOSIS — N361 Urethral diverticulum: Secondary | ICD-10-CM | POA: Diagnosis not present

## 2016-07-28 DIAGNOSIS — Z87440 Personal history of urinary (tract) infections: Secondary | ICD-10-CM | POA: Insufficient documentation

## 2016-07-28 HISTORY — PX: URETHRAL CYST REMOVAL: SHX5128

## 2016-07-28 SURGERY — EXCISION, CYST, PERIURETHRAL
Anesthesia: General

## 2016-07-28 MED ORDER — PROPOFOL 10 MG/ML IV BOLUS
INTRAVENOUS | Status: AC
  Filled 2016-07-28: qty 20

## 2016-07-28 MED ORDER — ONDANSETRON HCL 4 MG/2ML IJ SOLN
INTRAMUSCULAR | Status: AC
  Filled 2016-07-28: qty 2

## 2016-07-28 MED ORDER — FENTANYL CITRATE (PF) 100 MCG/2ML IJ SOLN
INTRAMUSCULAR | Status: DC | PRN
  Administered 2016-07-28: 100 ug via INTRAVENOUS
  Administered 2016-07-28 (×2): 50 ug via INTRAVENOUS

## 2016-07-28 MED ORDER — METHYLENE BLUE 0.5 % INJ SOLN
INTRAVENOUS | Status: DC | PRN
  Administered 2016-07-28: 1 mL

## 2016-07-28 MED ORDER — CEFAZOLIN SODIUM-DEXTROSE 2-4 GM/100ML-% IV SOLN
2.0000 g | INTRAVENOUS | Status: AC
  Administered 2016-07-28: 2 g via INTRAVENOUS

## 2016-07-28 MED ORDER — MIDAZOLAM HCL 5 MG/5ML IJ SOLN
INTRAMUSCULAR | Status: DC | PRN
  Administered 2016-07-28: 2 mg via INTRAVENOUS

## 2016-07-28 MED ORDER — METHYLENE BLUE 0.5 % INJ SOLN
INTRAVENOUS | Status: AC
  Filled 2016-07-28: qty 10

## 2016-07-28 MED ORDER — LIDOCAINE-EPINEPHRINE (PF) 1 %-1:200000 IJ SOLN
INTRAMUSCULAR | Status: AC
  Filled 2016-07-28: qty 30

## 2016-07-28 MED ORDER — OXYCODONE-ACETAMINOPHEN 5-325 MG PO TABS: 1.0000 | ORAL_TABLET | ORAL | 0 refills | Status: DC | PRN

## 2016-07-28 MED ORDER — ONDANSETRON HCL 4 MG/2ML IJ SOLN
INTRAMUSCULAR | Status: DC | PRN
  Administered 2016-07-28: 4 mg via INTRAVENOUS

## 2016-07-28 MED ORDER — FENTANYL CITRATE (PF) 100 MCG/2ML IJ SOLN
INTRAMUSCULAR | Status: AC
  Filled 2016-07-28: qty 2

## 2016-07-28 MED ORDER — FENTANYL CITRATE (PF) 100 MCG/2ML IJ SOLN: 25.0000 ug | INTRAMUSCULAR | Status: DC | PRN

## 2016-07-28 MED ORDER — EPHEDRINE 5 MG/ML INJ
INTRAVENOUS | Status: AC
  Filled 2016-07-28: qty 10

## 2016-07-28 MED ORDER — KETOROLAC TROMETHAMINE 30 MG/ML IJ SOLN
30.0000 mg | INTRAMUSCULAR | Status: DC
  Administered 2016-07-28: 30 mg via INTRAVENOUS

## 2016-07-28 MED ORDER — SODIUM CHLORIDE 0.9 % IR SOLN
Status: DC | PRN
  Administered 2016-07-28: 500 mL

## 2016-07-28 MED ORDER — ESTRADIOL 0.1 MG/GM VA CREA
TOPICAL_CREAM | VAGINAL | Status: DC | PRN
  Administered 2016-07-28: 1 via VAGINAL

## 2016-07-28 MED ORDER — DEXAMETHASONE SODIUM PHOSPHATE 10 MG/ML IJ SOLN
INTRAMUSCULAR | Status: AC
  Filled 2016-07-28: qty 1

## 2016-07-28 MED ORDER — STERILE WATER FOR IRRIGATION IR SOLN
Status: DC | PRN
  Administered 2016-07-28: 500 mL

## 2016-07-28 MED ORDER — LIDOCAINE 2% (20 MG/ML) 5 ML SYRINGE
INTRAMUSCULAR | Status: AC
Start: 2016-07-28 — End: 2016-07-28
  Filled 2016-07-28: qty 5

## 2016-07-28 MED ORDER — MIDAZOLAM HCL 2 MG/2ML IJ SOLN
INTRAMUSCULAR | Status: AC
  Filled 2016-07-28: qty 2

## 2016-07-28 MED ORDER — KETOROLAC TROMETHAMINE 30 MG/ML IJ SOLN
INTRAMUSCULAR | Status: AC
  Filled 2016-07-28: qty 1

## 2016-07-28 MED ORDER — LEVOTHYROXINE SODIUM 150 MCG PO TABS: 150.0000 ug | ORAL_TABLET | Freq: Every day | ORAL | Status: DC

## 2016-07-28 MED ORDER — EPHEDRINE SULFATE-NACL 50-0.9 MG/10ML-% IV SOSY
PREFILLED_SYRINGE | INTRAVENOUS | Status: DC | PRN
  Administered 2016-07-28: 5 mg via INTRAVENOUS
  Administered 2016-07-28: 10 mg via INTRAVENOUS

## 2016-07-28 MED ORDER — LACTATED RINGERS IV SOLN
INTRAVENOUS | Status: DC | PRN
  Administered 2016-07-28 (×2): via INTRAVENOUS

## 2016-07-28 MED ORDER — OXYCODONE HCL 5 MG PO TABS
5.0000 mg | ORAL_TABLET | ORAL | Status: DC | PRN
Start: 2016-07-28 — End: 2016-07-28

## 2016-07-28 MED ORDER — SODIUM CHLORIDE 0.9 % IR SOLN
Status: AC
  Filled 2016-07-28 (×2): qty 500000

## 2016-07-28 MED ORDER — ACETAMINOPHEN 500 MG PO TABS
1000.0000 mg | ORAL_TABLET | ORAL | Status: AC
Start: 2016-07-28 — End: 2016-07-28
  Administered 2016-07-28: 1000 mg via ORAL
  Filled 2016-07-28: qty 2

## 2016-07-28 MED ORDER — ESTRADIOL 0.1 MG/GM VA CREA
TOPICAL_CREAM | VAGINAL | Status: AC
  Filled 2016-07-28: qty 42.5

## 2016-07-28 MED ORDER — SODIUM CHLORIDE 0.9 % IR SOLN
Status: DC | PRN
  Administered 2016-07-28: 3000 mL via INTRAVESICAL

## 2016-07-28 MED ORDER — CEFAZOLIN SODIUM-DEXTROSE 2-4 GM/100ML-% IV SOLN
INTRAVENOUS | Status: AC
Start: 2016-07-28 — End: 2016-07-28
  Filled 2016-07-28: qty 100

## 2016-07-28 MED ORDER — PROPOFOL 10 MG/ML IV BOLUS
INTRAVENOUS | Status: DC | PRN
  Administered 2016-07-28: 200 mg via INTRAVENOUS

## 2016-07-28 MED ORDER — 0.9 % SODIUM CHLORIDE (POUR BTL) OPTIME
TOPICAL | Status: DC | PRN
  Administered 2016-07-28: 1000 mL

## 2016-07-28 MED ORDER — METOPROLOL SUCCINATE ER 50 MG PO TB24: 50.0000 mg | ORAL_TABLET | Freq: Every day | ORAL | Status: DC

## 2016-07-28 MED ORDER — DEXAMETHASONE SODIUM PHOSPHATE 10 MG/ML IJ SOLN
INTRAMUSCULAR | Status: DC | PRN
  Administered 2016-07-28: 10 mg via INTRAVENOUS

## 2016-07-28 MED ORDER — LIDOCAINE 2% (20 MG/ML) 5 ML SYRINGE
INTRAMUSCULAR | Status: DC | PRN
  Administered 2016-07-28: 100 mg via INTRAVENOUS

## 2016-07-28 MED ORDER — OXYCODONE HCL 5 MG PO TABS: 10.0000 mg | ORAL_TABLET | ORAL | Status: DC | PRN

## 2016-07-28 SURGICAL SUPPLY — 50 items
BAG URINE DRAINAGE (UROLOGICAL SUPPLIES) ×2 IMPLANT
BLADE HEX COATED 2.75 (ELECTRODE) ×2 IMPLANT
BLADE SURG 15 STRL LF DISP TIS (BLADE) ×2 IMPLANT
BLADE SURG 15 STRL SS (BLADE) ×4
BLADE SURG SZ10 CARB STEEL (BLADE) ×2 IMPLANT
CATH EMB 4FR 40CM (CATHETERS) IMPLANT
CATH EMB 4FR 80CM (CATHETERS) ×2 IMPLANT
CATH FOLEY 2WAY SLVR  5CC 16FR (CATHETERS) ×1
CATH FOLEY 2WAY SLVR 5CC 16FR (CATHETERS) ×1 IMPLANT
COVER MAYO STAND STRL (DRAPES) IMPLANT
DECANTER SPIKE VIAL GLASS SM (MISCELLANEOUS) ×2 IMPLANT
DRAIN PENROSE 18X1/2 LTX STRL (DRAIN) ×2 IMPLANT
DRAIN PENROSE 18X1/4 LTX STRL (WOUND CARE) IMPLANT
ELECT PENCIL ROCKER SW 15FT (MISCELLANEOUS) ×2 IMPLANT
GAUZE PACKING 2X5 YD STRL (GAUZE/BANDAGES/DRESSINGS) ×2 IMPLANT
GAUZE SPONGE 4X4 16PLY XRAY LF (GAUZE/BANDAGES/DRESSINGS) ×4 IMPLANT
GLOVE BIOGEL M STRL SZ7.5 (GLOVE) ×5 IMPLANT
GOWN STRL REUS W/TWL XL LVL3 (GOWN DISPOSABLE) ×2 IMPLANT
HOLDER FOLEY CATH W/STRAP (MISCELLANEOUS) ×2 IMPLANT
KIT BASIN OR (CUSTOM PROCEDURE TRAY) ×2 IMPLANT
NEEDLE HYPO 22GX1.5 SAFETY (NEEDLE) ×2 IMPLANT
PACK CYSTO (CUSTOM PROCEDURE TRAY) ×2 IMPLANT
PACKING VAGINAL (PACKING) ×1 IMPLANT
PLUG CATH AND CAP STER (CATHETERS) ×2 IMPLANT
RETRACTOR LONRSTAR 16.6X16.6CM (MISCELLANEOUS) ×1 IMPLANT
RETRACTOR STAY HOOK 5MM (MISCELLANEOUS) ×1 IMPLANT
RETRACTOR STER APS 16.6X16.6CM (MISCELLANEOUS) ×2
SHEET LAVH (DRAPES) ×2 IMPLANT
SPONGE LAP 4X18 X RAY DECT (DISPOSABLE) ×2 IMPLANT
SUCTION FRAZIER HANDLE 10FR (MISCELLANEOUS) ×1
SUCTION TUBE FRAZIER 10FR DISP (MISCELLANEOUS) ×1 IMPLANT
SUT MNCRL 3 0 RB1 (SUTURE) IMPLANT
SUT MNCRL AB 4-0 PS2 18 (SUTURE) ×3 IMPLANT
SUT MONOCRYL 3 0 RB1 (SUTURE) ×1
SUT SILK 2 0 SH (SUTURE) ×2 IMPLANT
SUT SILK 3 0 SH CR/8 (SUTURE) ×1 IMPLANT
SUT VIC AB 3-0 SH 27 (SUTURE) ×12
SUT VIC AB 3-0 SH 27XBRD (SUTURE) ×6 IMPLANT
SUT VIC AB 4-0 RB1 27 (SUTURE) ×4
SUT VIC AB 4-0 RB1 27XBRD (SUTURE) ×4 IMPLANT
SWAB COLLECTION DEVICE MRSA (MISCELLANEOUS) ×1 IMPLANT
SWAB CULTURE ESWAB REG 1ML (MISCELLANEOUS) ×1 IMPLANT
SYR 10ML LL (SYRINGE) ×2 IMPLANT
SYR CONTROL 10ML LL (SYRINGE) ×2 IMPLANT
SYRINGE 10CC LL (SYRINGE) ×1 IMPLANT
TUBING CONNECTING 10 (TUBING) ×2 IMPLANT
WATER STERILE IRR 1000ML POUR (IV SOLUTION) ×1 IMPLANT
WATER STERILE IRR 1000ML UROMA (IV SOLUTION) ×2 IMPLANT
WATER STERILE IRR 1500ML POUR (IV SOLUTION) ×2 IMPLANT
YANKAUER SUCT BULB TIP 10FT TU (MISCELLANEOUS) ×2 IMPLANT

## 2016-07-28 NOTE — Interval H&P Note (Signed)
History and Physical Interval Note:  07/28/2016 9:24 AM  Sherri Villarreal  has presented today for surgery, with the diagnosis of gartners duct cyst and periurethal mass  The various methods of treatment have been discussed with the patient and family. After consideration of risks, benefits and other options for treatment, the patient has consented to  Procedure(s): excision of infected paraurethral cyst (N/A) as a surgical intervention .  The patient's history has been reviewed, patient examined, no change in status, stable for surgery.  I have reviewed the patient's chart and labs.  Questions were answered to the patient's satisfaction.     Yvonnie Schinke I Geniece Akers

## 2016-07-28 NOTE — Interval H&P Note (Signed)
History and Physical Interval Note:  07/28/2016 7:12 AM  Beatris Si  has presented today for surgery, with the diagnosis of garters duct cyst and periurethal mass  The various methods of treatment have been discussed with the patient and family. After consideration of risks, benefits and other options for treatment, the patient has consented to  Procedure(s): EXCISION garters duct cyst (N/A) as a surgical intervention .  The patient's history has been reviewed, patient examined, no change in status, stable for surgery.  I have reviewed the patient's chart and labs.  Questions were answered to the patient's satisfaction.     Hagar Sadiq I Tressy Kunzman    Pt has enlarged, asymptomatic paraurethral cyst. MRI shows no evidence of urethral diverticulum. No dysuria, but pt notes spraying of her urinary stream. Denies dyspareunia. Physical exam shows large smooth paraurethral mass, non-mobile, predominately on the left side. It is not sensitive. She is for excision today. She may need catheterization post operatively.

## 2016-07-28 NOTE — Progress Notes (Signed)
Vaginal packing removed per Dr Arlyn Leak order. Pt tolerated well, site benign.

## 2016-07-28 NOTE — Op Note (Signed)
Pre-operative diagnosis :  Paraurethral cyst  Postoperative diagnosis: Infected paraurethral cyst, urethral caruncle  Operation: Excision of infected paraurethral cyst, cauterization of cyst base, cystoscopy, crede maneuver with methylene blue  Surgeon:  S. Gaynelle Arabian, MD  First assistant: Bjorn Loser, MD                          Dr. Marrion Coy  Anesthesia:  General LNA  Preparation: After appropriate preop anesthesia, the patient was brought the operating room, placed on the operating table in the dorsal supine position where general LMA anesthesia was introduced. She was then replaced in the dorsal lithotomy position with pubis was prepped with Betadine solution and draped in usual fashion. The arm band was double checked. The MRI report was double checked. The history was double checked.  Review history:  HPI: Sherri Villarreal is a 61 year-old female established patient who is here for urethral diverticulum.  Her problem was diagnosed approximately 02/16/2011. She has had it for 5 years. Her symptoms have been stable over the last year. She does not have a history of urinary infections.   She does not have burning or discomfort when she urinates. She has not had discharge. She does not have pain with intercourse. She did not see the blood in her unine.   no infection, no pain, 4 UTI in 2015, none in 2016. She notes spraying of urine with voiding. Physical exam showed 4cm L sided paraurethral mass, deflecting the urethra, firm, non-tender, without expression of urethral discharge. MRI shows mass, not connected to urehtra, probable cyst vs Gartner's duct cyst.     Statement of  Likelihood of Success: Excellent. TIME-OUT observed.:  Procedure: Vaginal examination or anesthesia revealed the patient has urethral carbuncle. In addition, there is a 4 cm solid feeling mass just to the left side of the urethra, extending from the 3:00 to the 6 clock position. The mass is somewhat mobile,  and tense. Consideration is given to waist to excise the mass, and I have elected to excise the mass with vertical incision, and dissection of the stomach cyst walls. Using a marking pen, the outline a vertical incision is accomplished. 2 3-0 silk sutures were used to place on either side of the midline of the cystic mass.  Cystourethroscopy was accomplished, with care taken to evaluate both the bladder wall side walls, and trigone, and particular attention to the urethra. There is no evidence of any communication with the cystic mass.  59 French Foley catheters placed to straight drainage with 10 mL of sterile water in the balloon.  Using a 15 blade, a shallow incision is made in the urethral mass. Subcutaneous tissue is dissected. There is a paperthin shell of vaginal mucosa over the mass. A thick greenish necrotic debris emanates from the mass, and is cultured with both aerobic and anaerobic cultures. A 3-0 Vicryl sutures placed through the cyst, nor to close the cyst, and keep the mass. superficial dissection is accomplished of the very thin vaginal mucosa, in order to dissect the mass as much as possible. This is accomplished, and proximalward, it is necessary to open the mass, drain the 20 mL of greenish necrotic debris. Following copious saline irrigation, the cyst was excised proximally were, sent to laboratory for examination. The cyst base was cauterized extensively. Minimal bleeding was noted for the entire procedure, with blood loss of less than 100 mL.  Foley catheter was drained of urine, and 400 mL of  methylene blue saline was then put in the bladder via the Foley catheter. Foley catheter was removed, and Cred maneuver was accomplished, showing that the urethra could be made to leak. With a thumb over the urethra, Cred maneuver was again accomplished, with attention to the operative site. No evidence of any blue saline was seen. Therefore, there was no connection between the cyst base, and  the urethra.  Foley catheter was then reinserted, and the bladder drained of fluid.The vaginal walls were then reapproximated, and epithelium was resized.  Closure: The vagina was closed in 2 layers, with internal layer of interrupted figure-of-eight 3-0 Monocryl sutures, and the superficial layer with interrupted and running 4-0 Monocryl sutures. Photodocumentation before and after was accomplished.

## 2016-07-28 NOTE — Transfer of Care (Signed)
Immediate Anesthesia Transfer of Care Note  Patient: Sherri Villarreal  Procedure(s) Performed: Procedure(s): excision of infected paraurethral cyst (N/A)  Patient Location: PACU  Anesthesia Type:General  Level of Consciousness: sedated  Airway & Oxygen Therapy: Patient Spontanous Breathing and Patient connected to face mask oxygen  Post-op Assessment: Report given to RN and Post -op Vital signs reviewed and stable  Post vital signs: Reviewed and stable  Last Vitals:  Vitals:   07/28/16 0554  BP: 138/71  Pulse: 64  Resp: 16  Temp: 36.6 C    Last Pain:  Vitals:   07/28/16 0554  TempSrc: Oral      Patients Stated Pain Goal: 3 [phone]  Complications: No apparent anesthesia complications

## 2016-07-28 NOTE — Discharge Instructions (Addendum)
Bartholin Cyst or Abscess Introduction A Bartholin cyst is a fluid-filled sac that forms on a Bartholin gland. Bartholin glands are small glands that are located within the folds of skin (labia) along the sides of the lower opening of the vagina. These glands produce a fluid to moisten the outside of the vagina during sexual intercourse. A Bartholin cyst causes a bulge on the side of the vagina. A cyst that is not large or infected may not cause symptoms or problems. However, if the fluid within the cyst becomes infected, the cyst can turn into an abscess. An abscess may cause discomfort or pain. What are the causes? A Bartholin cyst may develop when the duct of the gland becomes blocked. In many cases, the cause of this is not known. Various kinds of bacteria can cause the cyst to become infected and develop into an abscess. What increases the risk? You may be at an increased risk of developing a Bartholin cyst or abscess if:  You are a woman of reproductive age.  You have a history of previous Bartholin cysts or abscesses.  You have diabetes.  You have a sexually transmitted disease (STD). What are the signs or symptoms? The severity of symptoms varies depending on the size of the cyst and whether it is infected. Symptoms may include:  A bulge or swelling near the lower opening of your vagina.  Discomfort or pain.  Redness.  Pain during sexual intercourse.  Pain when walking.  Fluid draining from the area. How is this diagnosed? Your health care provider may make a diagnosis based on your symptoms and a physical exam. He or she will look for swelling in your vaginal area. Blood tests may be done to check for infections. A sample of fluid from the cyst or abscess may also be taken to be tested in a lab. How is this treated? Small cysts that are not infected may not require any treatment. These often go away on their own. Yourhealth care provider will recommend hot baths and the use  of warm compresses. These may also be part of the treatment for an abscess. Treatment options for a large cyst or abscess may include:  Antibiotic medicine.  A surgical procedure to drain the abscess. One of the following procedures may be done:  Incision and drainage. An incision is made in the cyst or abscess so that the fluid drains out. A catheter may be placed inside the cyst so that it does not close and fill up with fluid again. The catheter will be removed after you have a follow-up visit with a specialist (gynecologist).  Marsupialization. The cyst or abscess is opened and kept open by stitching the edges of the skin to the walls of the cyst or abscess. This allows it to continue to drain and not fill up with fluid again. If you have cysts or abscesses that keep returning and have required incision and drainage multiple times, your health care provider may talk to you about surgery to remove the Bartholin gland. Follow these instructions at home:  Take medicines only as directed by your health care provider.  If you were prescribed an antibiotic medicine, finish it all even if you start to feel better.  Apply warm, wet compresses to the area or take warm, shallow baths that cover your pelvic region (sitz baths) several times a day or as directed by your health care provider.  Do not squeeze the cyst or apply heavy pressure to it.  Do not  have sexual intercourse until the cyst has gone away.  If your cyst or abscess was opened, a small piece of gauze or a drain may have been placed in the area to allow drainage. Do not remove the gauze or the drain until directed by your health care provider.  Wear feminine pads--not tampons--as needed for any drainage or bleeding.  Keep all follow-up visits as directed by your health care provider. This is important. How is this prevented? Take these steps to help prevent a Bartholin cyst from returning:  Practice good hygiene.  Clean your  vaginal area with mild soap and a soft cloth when you bathe.  Practice safe sex to prevent STDs. Contact a health care provider if:  You have increased pain, swelling, or redness in the area of the cyst.  Puslike drainage is coming from the cyst.  You have a fever. This information is not intended to replace advice given to you by your health care provider. Make sure you discuss any questions you have with your health care provider. Document Released: 07/04/2005 Document Revised: 12/10/2015 Document Reviewed: 02/17/2014  2017 Elsevier   General Anesthesia, Adult, Care After These instructions provide you with information about caring for yourself after your procedure. Your health care provider may also give you more specific instructions. Your treatment has been planned according to current medical practices, but problems sometimes occur. Call your health care provider if you have any problems or questions after your procedure. What can I expect after the procedure? After the procedure, it is common to have:  Vomiting.  A sore throat.  Mental slowness. It is common to feel:  Nauseous.  Cold or shivery.  Sleepy.  Tired.  Sore or achy, even in parts of your body where you did not have surgery. Follow these instructions at home: For at least 24 hours after the procedure:  Do not:  Participate in activities where you could fall or become injured.  Drive.  Use heavy machinery.  Drink alcohol.  Take sleeping pills or medicines that cause drowsiness.  Make important decisions or sign legal documents.  Take care of children on your own.  Rest. Eating and drinking  If you vomit, drink water, juice, or soup when you can drink without vomiting.  Drink enough fluid to keep your urine clear or pale yellow.  Make sure you have little or no nausea before eating solid foods.  Follow the diet recommended by your health care provider. General instructions  Have a  responsible adult stay with you until you are awake and alert.  Return to your normal activities as told by your health care provider. Ask your health care provider what activities are safe for you.  Take over-the-counter and prescription medicines only as told by your health care provider.  If you smoke, do not smoke without supervision.  Keep all follow-up visits as told by your health care provider. This is important. Contact a health care provider if:  You continue to have nausea or vomiting at home, and medicines are not helpful.  You cannot drink fluids or start eating again.  You cannot urinate after 8-12 hours.  You develop a skin rash.  You have fever.  You have increasing redness at the site of your procedure. Get help right away if:  You have difficulty breathing.  You have chest pain.  You have unexpected bleeding.  You feel that you are having a life-threatening or urgent problem. This information is not intended to replace advice  given to you by your health care provider. Make sure you discuss any questions you have with your health care provider. Document Released: 10/10/2000 Document Revised: 12/07/2015 Document Reviewed: 06/18/2015 Elsevier Interactive Patient Education  2017 [employer].

## 2016-07-28 NOTE — Anesthesia Procedure Notes (Signed)
Procedure Name: LMA Insertion Date/Time: 07/28/2016 7:33 AM Performed by: Lind Covert Pre-anesthesia Checklist: Patient identified, Emergency Drugs available, Suction available, Patient being monitored and Timeout performed Patient Re-evaluated:Patient Re-evaluated prior to inductionOxygen Delivery Method: Circle system utilized Preoxygenation: Pre-oxygenation with 100% oxygen Intubation Type: IV induction LMA: LMA inserted LMA Size: 4.0 Number of attempts: 1 Placement Confirmation: positive ETCO2 and breath sounds checked- equal and bilateral Tube secured with: Tape Dental Injury: Teeth and Oropharynx as per pre-operative assessment

## 2016-07-28 NOTE — Anesthesia Postprocedure Evaluation (Signed)
Anesthesia Post Note  Patient: Sherri Villarreal  Procedure(s) Performed: Procedure(s) (LRB): excision of infected paraurethral cyst (N/A)  Patient location during evaluation: PACU Anesthesia Type: General Level of consciousness: awake and alert Pain management: satisfactory to patient Vital Signs Assessment: post-procedure vital signs reviewed and stable Respiratory status: spontaneous breathing Cardiovascular status: stable Anesthetic complications: no       Last Vitals:  Vitals:   07/28/16 1102 07/28/16 1137  BP: 125/60 (!) 121/59  Pulse: 69 72  Resp: 12 16  Temp: 36.4 C 36.5 C    Last Pain:  Vitals:   07/28/16 1137  TempSrc: Oral  PainSc:                  [name]

## 2016-07-29 NOTE — Discharge Summary (Signed)
Physician Discharge Summary  Patient ID: MISCHELE HAIRGROVE MRN: [id number] DOB/AGE: September 22, 1955 61 y.o.  Admit date: 07/28/2016 Discharge date: 07/29/2016  Admission Diagnoses: Periurethral cyst Discharge Diagnoses:  Same  Discharged Condition: Stable  Hospital Course: Excision of paraurethral cyst.   Significant Diagnostic Studies: No results found.  Discharge Exam: Blood pressure (!) 121/59, pulse 72, temperature 97.7 F (36.5 C), temperature source Oral, resp. rate 16, height 5\' 7"  (1.702 m), weight 75.3 kg (166 lb), SpO2 100 %.   Disposition: 01-Home or Self Care  Discharge Instructions    Care order/instruction    Complete by:  As directed    Remove vaginal packing prior to discharge   Care order/instruction:    Complete by:  As directed    D/c vaginal packing  D/c from  Short Stay to Home   Discharge instructions    Complete by:  As directed    As discussed with Dr. Gaynelle Arabian pre-operatively.  You may resume aspirin, advil, aleve, vitamins, and supplements 7 days after surgery.  Activity:  You are encouraged to ambulate frequently (about every hour during waking hours) to help prevent blood clots from forming in your legs or lungs.  However, you should not engage in any heavy lifting (> 10-15 lbs), strenuous activity, or straining for 2 weeks. It is OK to start driving when you are off narcotic pain medications. No intercourse or placing anything within the vagina for 6 weeks.   Diet: You may resume a regular diet.  Prescriptions: Complete the course of antibiotics (Bactrim) which was prescribed to you. You will be provided a prescription for pain medication to take as needed.  If your pain is not severe enough to require the prescription pain medication, you may take extra strength Tylenol instead which will have less side effects.  You should also take an over the counter stool softener to avoid straining with bowel movements as the prescription pain medication may  constipate you. Do not drive while taking narcotic pain medications. Take Colace and/or Senna while taking narcotic pain medication. You may take over-the-counter Laxatives such as Miralax, Senna, or Milk of Magnesia. Stop taking these medications if you develop diarrhea.  You may start showering (but not soaking or bathing in water) the 2nd day after surgery and the incisions simply need to be patted dry after the shower. No baths or submerging for 2 weeks. Do not scrub over the incisions, simply let soap and water run over them.  Do not soak the incision (such as a bath) for 2 weeks after surgery.  No additional care is needed.  What to call us about: You should call the office [phone]) if you develop fever > 101, significant bleeding, develop persistent vomiting.  Resume home medications as prescribed by your primary care physician.  It is normal to have some vaginal spotting and bleeding for 1-2 weeks following this surgery.  If you are unable to urinate please call Dr. Arlyn Leak office.   Discontinue IV    Complete by:  As directed      Allergies as of 07/28/2016   No Known Allergies     Medication List    TAKE these medications   alendronate 70 MG tablet Commonly known as:  FOSAMAX Take 70 mg by mouth every Monday.   Calcium Carb-Cholecalciferol [phone] MG-UNIT Caps Take 1 tablet by mouth every evening.   cholecalciferol 1000 units tablet Commonly known as:  VITAMIN D Take 1,000 Units by mouth every evening.   Vitamin D  2000 units Caps Take 2,000 Units by mouth every evening.   metoprolol succinate 50 MG 24 hr tablet Commonly known as:  TOPROL-XL TAKE 1 TABLET BY MOUTH EVERY DAY TAKE WITH OR IMMEDIATELY AFTER A MEAL What changed:  See the new instructions.   oxyCODONE-acetaminophen 5-325 MG tablet Commonly known as:  ROXICET Take 1-2 tablets by mouth every 4 (four) hours as needed for severe pain.   SYNTHROID 150 MCG tablet Generic drug:   levothyroxine Take 150 mcg by mouth daily.   Vitamin B-12 2000 MCG Tbcr Take 2,000 mcg by mouth every evening.      Follow-up Information    Ailene Rud, MD.   Specialty:  Urology Contact information: [place] [address] [phone]           Signed: Ailene Rud 07/29/2016, 10:16 AM

## 2016-08-02 LAB — AEROBIC/ANAEROBIC CULTURE W GRAM STAIN (SURGICAL/DEEP WOUND)
Culture: NO GROWTH
Gram Stain: NONE SEEN

## 2016-08-02 LAB — AEROBIC/ANAEROBIC CULTURE (SURGICAL/DEEP WOUND)

## 2016-08-05 DIAGNOSIS — N361 Urethral diverticulum: Secondary | ICD-10-CM | POA: Diagnosis not present

## 2016-09-06 DIAGNOSIS — N361 Urethral diverticulum: Secondary | ICD-10-CM | POA: Diagnosis not present

## 2016-09-29 DIAGNOSIS — E538 Deficiency of other specified B group vitamins: Secondary | ICD-10-CM | POA: Diagnosis not present

## 2016-09-29 DIAGNOSIS — E559 Vitamin D deficiency, unspecified: Secondary | ICD-10-CM | POA: Diagnosis not present

## 2016-09-29 DIAGNOSIS — E039 Hypothyroidism, unspecified: Secondary | ICD-10-CM | POA: Diagnosis not present

## 2016-09-29 DIAGNOSIS — E782 Mixed hyperlipidemia: Secondary | ICD-10-CM | POA: Diagnosis not present

## 2016-10-05 DIAGNOSIS — E039 Hypothyroidism, unspecified: Secondary | ICD-10-CM | POA: Diagnosis not present

## 2016-10-11 ENCOUNTER — Other Ambulatory Visit: Payer: Self-pay | Admitting: Obstetrics and Gynecology

## 2016-10-11 DIAGNOSIS — Z1231 Encounter for screening mammogram for malignant neoplasm of breast: Secondary | ICD-10-CM

## 2016-11-01 ENCOUNTER — Ambulatory Visit
Admission: RE | Admit: 2016-11-01 | Discharge: 2016-11-01 | Disposition: A | Discharge status: Home / Self Care | Payer: 59 | Source: Ambulatory Visit | Attending: Obstetrics and Gynecology | Admitting: Obstetrics and Gynecology

## 2016-11-01 DIAGNOSIS — Z1231 Encounter for screening mammogram for malignant neoplasm of breast: Secondary | ICD-10-CM

## 2016-11-02 ENCOUNTER — Other Ambulatory Visit: Payer: Self-pay | Admitting: Obstetrics and Gynecology

## 2016-11-02 DIAGNOSIS — R928 Other abnormal and inconclusive findings on diagnostic imaging of breast: Secondary | ICD-10-CM

## 2016-11-07 ENCOUNTER — Ambulatory Visit
Admission: RE | Admit: 2016-11-07 | Discharge: 2016-11-07 | Disposition: A | Discharge status: Home / Self Care | Payer: 59 | Source: Ambulatory Visit | Attending: Obstetrics and Gynecology | Admitting: Obstetrics and Gynecology

## 2016-11-07 DIAGNOSIS — N6002 Solitary cyst of left breast: Secondary | ICD-10-CM | POA: Diagnosis not present

## 2016-11-07 DIAGNOSIS — R928 Other abnormal and inconclusive findings on diagnostic imaging of breast: Secondary | ICD-10-CM

## 2016-11-18 ENCOUNTER — Other Ambulatory Visit: Payer: Self-pay | Admitting: Cardiology

## 2016-12-06 ENCOUNTER — Other Ambulatory Visit: Payer: Self-pay | Admitting: Cardiology

## 2016-12-13 ENCOUNTER — Other Ambulatory Visit: Payer: Self-pay | Admitting: *Deleted

## 2016-12-13 DIAGNOSIS — H25011 Cortical age-related cataract, right eye: Secondary | ICD-10-CM | POA: Diagnosis not present

## 2016-12-19 ENCOUNTER — Other Ambulatory Visit: Payer: Self-pay | Admitting: Cardiology

## 2016-12-19 ENCOUNTER — Ambulatory Visit (INDEPENDENT_AMBULATORY_CARE_PROVIDER_SITE_OTHER): Payer: 59 | Admitting: Cardiology

## 2016-12-19 ENCOUNTER — Encounter: Payer: Self-pay | Admitting: Cardiology

## 2016-12-19 ENCOUNTER — Encounter (INDEPENDENT_AMBULATORY_CARE_PROVIDER_SITE_OTHER): Payer: Self-pay

## 2016-12-19 VITALS — BP 144/80 | HR 80 | Ht 67.5 in | Wt 165.6 lb

## 2016-12-19 DIAGNOSIS — I493 Ventricular premature depolarization: Secondary | ICD-10-CM

## 2016-12-19 DIAGNOSIS — R002 Palpitations: Secondary | ICD-10-CM

## 2016-12-19 MED ORDER — METOPROLOL SUCCINATE ER 50 MG PO TB24: ORAL_TABLET | ORAL | 3 refills | Status: DC

## 2016-12-19 NOTE — Patient Instructions (Signed)

## 2016-12-19 NOTE — Progress Notes (Signed)
[facility]. [address], [address], [place]  [address] Phone: [phone] Fax:  [phone]  Date:  12/19/2016   ID:  Sherri Villarreal, DOB 1955-08-18, MRN [id number]  PCP:  Leighton Ruff, MD   History of Present Illness: Sherri Villarreal is a 61 y.o. female here for follow up of palpitations she has had a history of hypothyroidism. She's noticed skipping of the be on a daily basis. TSH was normal. I started metoprolol 25 mg extended release once a day. This was after her Holter monitor demonstrated 7000 PVCs. Her echocardiogram demonstrated normal ejection fraction with mild MR/TR.she is still stating that she has not had any relief from her symptoms with the metoprolol 25 mg. I increased it to 50 mg a day. I discussed with her that since these PVCs are symptomatic, we could consider antiarrhythmic therapy. She denies any high-risk symptoms such as syncope, chest pain.   Currently she is doing quite well. occasionally she will feel a fluttering sensation but this is quite rare. She did ask if she would need to be on the metoprolol lifelong. We discussed on 02/24/11 that if she is having a period time where the palpitations are decreased significantly, she can try to taper off of the metoprolol as we discussed. I asked her to call me if she does do this for update.  On 09/11/12, she did call for an update on increasing palpitations. She requested that we increase her metoprolol. I obliged and increased to 50 mg. Today she is here for followup.Feels better. Asked about correlation with Vit D?  03/08/13 - Doing well. Rare daily palps. No syncope. On 50mg  Metop.  03/12/14 - Can't remember last palpitations. B12 low, Vit D. doing well. Her father had mitral valve repair by Dr. Roxy Manns on 03/11/14.  03/12/15 - Doing really well. No recent palpitations.   12/19/16  - now on atorvastatin  - was hesitant at first, feels fine on this medication. Occasional palpitations. No chest pain.   Wt Readings  from Last 3 Encounters:  12/19/16 165 lb 9.6 oz (75.1 kg)  07/28/16 166 lb (75.3 kg)  07/27/16 166 lb 3.2 oz (75.4 kg)     Past Medical History:  Diagnosis Date  . Anosmia    partial   . B12 deficiency   . Frequent PVCs   . Headache    ocassional migraines  . History of hiatal hernia    inguinal hernia  . Hypothyroidism     Past Surgical History:  Procedure Laterality Date  . BREAST BIOPSY    . COLONOSCOPY  last in 2015   q 5 years   . TONSILLECTOMY     removed in childhood  . URETHRAL CYST REMOVAL N/A 07/28/2016   Procedure: excision of infected paraurethral cyst;  Surgeon: Carolan Clines, MD;  Location: [facility];  Service: Urology;  Laterality: N/A;    Current Outpatient Prescriptions  Medication Sig Dispense Refill  . alendronate (FOSAMAX) 70 MG tablet Take 70 mg by mouth every Monday.  6  . atorvastatin (LIPITOR) 10 MG tablet Take 10 mg by mouth daily.  2  . Calcium Carb-Cholecalciferol [phone] MG-UNIT CAPS Take 1 tablet by mouth every evening.    . cholecalciferol (VITAMIN D) 1000 units tablet Take 1,000 Units by mouth every evening.    . Cholecalciferol (VITAMIN D) 2000 units CAPS Take 2,000 Units by mouth every evening.    . Cyanocobalamin (VITAMIN B-12) 2000 MCG TBCR Take 2,000 mcg  by mouth every evening.     . metoprolol succinate (TOPROL-XL) 50 MG 24 hr tablet TAKE 1 TABLET BY MOUTH EVERY DAY TAKE WITH OR IMMEDIATELY AFTER A MEAL 90 tablet 3  . oxyCODONE-acetaminophen (ROXICET) 5-325 MG tablet Take 1-2 tablets by mouth every 4 (four) hours as needed for severe pain. 21 tablet 0  . SYNTHROID 175 MCG tablet Take 175 mcg by mouth daily. 175  2   No current facility-administered medications for this visit.     Allergies:   No Known Allergies  Social History:  The patient  reports that she has never smoked. She has never used smokeless tobacco. She reports that she drinks alcohol. She reports that she does not use drugs.   No family history on file. Father had  MI and WPW. MVR Dr. Roxy Manns. Mother with AFIB  ROS:  Please see the history of present illness.   No fevers chills orthopnea PND All other systems reviewed and negative.   PHYSICAL EXAM: VS:  BP (!) 144/80   Pulse 80   Ht 5' 7.5" (1.715 m)   Wt 165 lb 9.6 oz (75.1 kg)   LMP  (LMP Unknown)   BMI 25.55 kg/m  GEN: Well nourished, well developed, in no acute distress  HEENT: normal  Neck: no JVD, carotid bruits, or masses Cardiac: RRR; no murmurs, rubs, or gallops,no edema  Respiratory:  clear to auscultation bilaterally, normal work of breathing GI: soft, nontender, nondistended, + BS MS: no deformity or atrophy  Skin: warm and dry, no rash Neuro:  Alert and Oriented x 3, Strength and sensation are intact Psych: euthymic mood, full affect   EKG:  Today 03/12/15 - NSR no changes. Personally viewed. 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 Occasional symptoms. She still feels palpitations at random times. The Toprol seems to be helping though.  Hyperlipidemia  - Atorvastatin has been started by Dr. Drema Dallas. Her total cholesterol used to be in the 240 range. Previously in the 200 range. We discussed. She is not having any side effects. Continue.  One-year follow-up  Signed, Candee Furbish, MD [facility]  12/19/2016 5:03 PM

## 2017-01-04 DIAGNOSIS — E7800 Pure hypercholesterolemia, unspecified: Secondary | ICD-10-CM | POA: Diagnosis not present

## 2017-01-04 DIAGNOSIS — E039 Hypothyroidism, unspecified: Secondary | ICD-10-CM | POA: Diagnosis not present

## 2017-01-04 DIAGNOSIS — E559 Vitamin D deficiency, unspecified: Secondary | ICD-10-CM | POA: Diagnosis not present

## 2017-01-30 DIAGNOSIS — Z01419 Encounter for gynecological examination (general) (routine) without abnormal findings: Secondary | ICD-10-CM | POA: Diagnosis not present

## 2017-03-29 DIAGNOSIS — E038 Other specified hypothyroidism: Secondary | ICD-10-CM | POA: Diagnosis not present

## 2017-04-03 DIAGNOSIS — E559 Vitamin D deficiency, unspecified: Secondary | ICD-10-CM | POA: Diagnosis not present

## 2017-04-03 DIAGNOSIS — E782 Mixed hyperlipidemia: Secondary | ICD-10-CM | POA: Diagnosis not present

## 2017-04-03 DIAGNOSIS — E039 Hypothyroidism, unspecified: Secondary | ICD-10-CM | POA: Diagnosis not present

## 2017-04-17 DIAGNOSIS — Z13820 Encounter for screening for osteoporosis: Secondary | ICD-10-CM | POA: Diagnosis not present

## 2017-06-14 DIAGNOSIS — D485 Neoplasm of uncertain behavior of skin: Secondary | ICD-10-CM | POA: Diagnosis not present

## 2017-06-14 DIAGNOSIS — L820 Inflamed seborrheic keratosis: Secondary | ICD-10-CM | POA: Diagnosis not present

## 2017-06-19 DIAGNOSIS — Z23 Encounter for immunization: Secondary | ICD-10-CM | POA: Diagnosis not present

## 2017-08-21 DIAGNOSIS — E782 Mixed hyperlipidemia: Secondary | ICD-10-CM | POA: Diagnosis not present

## 2017-08-21 DIAGNOSIS — E538 Deficiency of other specified B group vitamins: Secondary | ICD-10-CM | POA: Diagnosis not present

## 2017-08-21 DIAGNOSIS — E039 Hypothyroidism, unspecified: Secondary | ICD-10-CM | POA: Diagnosis not present

## 2017-08-21 DIAGNOSIS — E559 Vitamin D deficiency, unspecified: Secondary | ICD-10-CM | POA: Diagnosis not present

## 2017-09-26 DIAGNOSIS — M546 Pain in thoracic spine: Secondary | ICD-10-CM | POA: Diagnosis not present

## 2017-09-26 DIAGNOSIS — M5136 Other intervertebral disc degeneration, lumbar region: Secondary | ICD-10-CM | POA: Diagnosis not present

## 2017-09-26 DIAGNOSIS — M4726 Other spondylosis with radiculopathy, lumbar region: Secondary | ICD-10-CM | POA: Diagnosis not present

## 2017-09-26 DIAGNOSIS — Q762 Congenital spondylolisthesis: Secondary | ICD-10-CM | POA: Diagnosis not present

## 2017-09-26 DIAGNOSIS — M48062 Spinal stenosis, lumbar region with neurogenic claudication: Secondary | ICD-10-CM | POA: Diagnosis not present

## 2017-09-27 DIAGNOSIS — M48062 Spinal stenosis, lumbar region with neurogenic claudication: Secondary | ICD-10-CM | POA: Diagnosis not present

## 2017-09-27 DIAGNOSIS — M545 Low back pain: Secondary | ICD-10-CM | POA: Diagnosis not present

## 2017-10-04 DIAGNOSIS — M4726 Other spondylosis with radiculopathy, lumbar region: Secondary | ICD-10-CM | POA: Diagnosis not present

## 2017-10-04 DIAGNOSIS — M48062 Spinal stenosis, lumbar region with neurogenic claudication: Secondary | ICD-10-CM | POA: Diagnosis not present

## 2017-10-04 DIAGNOSIS — M4306 Spondylolysis, lumbar region: Secondary | ICD-10-CM | POA: Diagnosis not present

## 2017-10-23 DIAGNOSIS — M5136 Other intervertebral disc degeneration, lumbar region: Secondary | ICD-10-CM | POA: Diagnosis not present

## 2017-10-23 DIAGNOSIS — M48061 Spinal stenosis, lumbar region without neurogenic claudication: Secondary | ICD-10-CM | POA: Diagnosis not present

## 2017-10-23 DIAGNOSIS — M4726 Other spondylosis with radiculopathy, lumbar region: Secondary | ICD-10-CM | POA: Diagnosis not present

## 2017-11-01 ENCOUNTER — Other Ambulatory Visit: Payer: Self-pay | Admitting: Obstetrics and Gynecology

## 2017-11-01 DIAGNOSIS — Z1231 Encounter for screening mammogram for malignant neoplasm of breast: Secondary | ICD-10-CM

## 2017-11-17 DIAGNOSIS — M5136 Other intervertebral disc degeneration, lumbar region: Secondary | ICD-10-CM | POA: Diagnosis not present

## 2017-11-17 DIAGNOSIS — M5126 Other intervertebral disc displacement, lumbar region: Secondary | ICD-10-CM | POA: Diagnosis not present

## 2017-11-17 DIAGNOSIS — Q762 Congenital spondylolisthesis: Secondary | ICD-10-CM | POA: Diagnosis not present

## 2017-11-20 DIAGNOSIS — E7800 Pure hypercholesterolemia, unspecified: Secondary | ICD-10-CM | POA: Diagnosis not present

## 2017-11-22 ENCOUNTER — Ambulatory Visit
Admission: RE | Admit: 2017-11-22 | Discharge: 2017-11-22 | Disposition: A | Discharge status: Home / Self Care | Payer: 59 | Source: Ambulatory Visit | Attending: Obstetrics and Gynecology | Admitting: Obstetrics and Gynecology

## 2017-11-22 DIAGNOSIS — Z1231 Encounter for screening mammogram for malignant neoplasm of breast: Secondary | ICD-10-CM | POA: Diagnosis not present

## 2017-11-24 DIAGNOSIS — E559 Vitamin D deficiency, unspecified: Secondary | ICD-10-CM | POA: Diagnosis not present

## 2017-12-20 DIAGNOSIS — H2512 Age-related nuclear cataract, left eye: Secondary | ICD-10-CM | POA: Diagnosis not present

## 2017-12-20 DIAGNOSIS — H25011 Cortical age-related cataract, right eye: Secondary | ICD-10-CM | POA: Diagnosis not present

## 2017-12-26 ENCOUNTER — Other Ambulatory Visit: Payer: Self-pay | Admitting: Cardiology

## 2018-01-05 ENCOUNTER — Ambulatory Visit: Payer: 59 | Admitting: Cardiology

## 2018-01-29 DIAGNOSIS — E538 Deficiency of other specified B group vitamins: Secondary | ICD-10-CM | POA: Diagnosis not present

## 2018-01-29 DIAGNOSIS — E7800 Pure hypercholesterolemia, unspecified: Secondary | ICD-10-CM | POA: Diagnosis not present

## 2018-01-29 DIAGNOSIS — E559 Vitamin D deficiency, unspecified: Secondary | ICD-10-CM | POA: Diagnosis not present

## 2018-02-01 DIAGNOSIS — H25811 Combined forms of age-related cataract, right eye: Secondary | ICD-10-CM | POA: Diagnosis not present

## 2018-02-01 DIAGNOSIS — H2511 Age-related nuclear cataract, right eye: Secondary | ICD-10-CM | POA: Diagnosis not present

## 2018-02-12 DIAGNOSIS — Z01419 Encounter for gynecological examination (general) (routine) without abnormal findings: Secondary | ICD-10-CM | POA: Diagnosis not present

## 2018-02-12 DIAGNOSIS — Z6825 Body mass index (BMI) 25.0-25.9, adult: Secondary | ICD-10-CM | POA: Diagnosis not present

## 2018-02-14 NOTE — Progress Notes (Deleted)
[place] [facility]. [address], [place]  [address] Phone: [phone] Fax:  [phone]  Date:  02/14/2018   ID:  Sherri Villarreal, DOB [birth year], MRN [id number]  PCP:  Leighton Ruff, MD   History of Present Illness: Sherri Villarreal is a 62 y.o. female here for follow up of palpitations she has had a history of hypothyroidism. She's noticed skipping of the be on a daily basis. TSH was normal. I started metoprolol 25 mg extended release once a day. This was after her Holter monitor demonstrated 7000 PVCs. Her echocardiogram demonstrated normal ejection fraction with mild MR/TR.she is still stating that she has not had any relief from her symptoms with the metoprolol 25 mg. I increased it to 50 mg a day. I discussed with her that since these PVCs are symptomatic, we could consider antiarrhythmic therapy. She denies any high-risk symptoms such as syncope, chest pain.   Currently she is doing quite well. occasionally she will feel a fluttering sensation but this is quite rare. She did ask if she would need to be on the metoprolol lifelong. We discussed on 02/24/11 that if she is having a period time where the palpitations are decreased significantly, she can try to taper off of the metoprolol as we discussed. I asked her to call me if she does do this for update.  On 09/11/12, she did call for an update on increasing palpitations. She requested that we increase her metoprolol. I obliged and increased to 50 mg. Today she is here for followup.Feels better. Asked about correlation with Vit D?  03/08/13 - Doing well. Rare daily palps. No syncope. On 50mg  Metop.  03/12/14 - Can't remember last palpitations. B12 low, Vit D. doing well. Her father had mitral valve repair by Dr. Roxy Manns on 03/11/14.  03/12/15 - Doing really well. No recent palpitations.   12/19/16  - now on atorvastatin  - was hesitant at first, feels fine on this medication. Occasional palpitations. No chest pain.  02/14/18 -     Wt Readings from Last 3 Encounters:  12/19/16 165 lb 9.6 oz (75.1 kg)  07/28/16 166 lb (75.3 kg)  07/27/16 166 lb 3.2 oz (75.4 kg)     Past Medical History:  Diagnosis Date  . Anosmia    partial   . B12 deficiency   . Frequent PVCs   . Headache    ocassional migraines  . History of hiatal hernia    inguinal hernia  . Hypothyroidism     Past Surgical History:  Procedure Laterality Date  . BREAST BIOPSY Right    benign  . COLONOSCOPY  last in 2015   q 5 years   . TONSILLECTOMY     removed in childhood  . URETHRAL CYST REMOVAL N/A 07/28/2016   Procedure: excision of infected paraurethral cyst;  Surgeon: Carolan Clines, MD;  Location: [facility];  Service: Urology;  Laterality: N/A;    Current Outpatient Medications  Medication Sig Dispense Refill  . alendronate (FOSAMAX) 70 MG tablet Take 70 mg by mouth every Monday.  6  . atorvastatin (LIPITOR) 10 MG tablet Take 10 mg by mouth daily.  2  . Calcium Carb-Cholecalciferol [phone] MG-UNIT CAPS Take 1 tablet by mouth every evening.    . cholecalciferol (VITAMIN D) 1000 units tablet Take 1,000 Units by mouth every evening.    . Cholecalciferol (VITAMIN D) 2000 units CAPS Take 2,000 Units by mouth every evening.    . Cyanocobalamin (VITAMIN  B-12) 2000 MCG TBCR Take 2,000 mcg by mouth every evening.     . metoprolol succinate (TOPROL-XL) 50 MG 24 hr tablet TAKE 1 TABLET BY MOUTH EVERY DAY TAKE WITH OR IMMEDIATELY AFTER A MEAL 90 tablet 0  . oxyCODONE-acetaminophen (ROXICET) 5-325 MG tablet Take 1-2 tablets by mouth every 4 (four) hours as needed for severe pain. 21 tablet 0  . SYNTHROID 175 MCG tablet Take 175 mcg by mouth daily. 175  2   No current facility-administered medications for this visit.     Allergies:   No Known Allergies  Social History:  The patient  reports that she has never smoked. She has never used smokeless tobacco. She reports that she drinks alcohol. She reports that she does not use drugs.   No  family history on file. Father had MI and WPW. MVR Dr. Roxy Manns. Mother with AFIB  ROS:  Please see the history of present illness.   All others negative.  PHYSICAL EXAM: VS:  LMP  (LMP Unknown)  GEN: Well nourished, well developed, in no acute distress  HEENT: normal  Neck: no JVD, carotid bruits, or masses Cardiac: ***RRR; no murmurs, rubs, or gallops,no edema  Respiratory:  clear to auscultation bilaterally, normal work of breathing GI: soft, nontender, nondistended, + BS MS: no deformity or atrophy  Skin: warm and dry, no rash Neuro:  Alert and Oriented x 3, Strength and sensation are intact Psych: euthymic mood, full affect    EKG:  Today 03/12/15 - NSR no changes. Personally viewed. 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 Occasional symptoms. She still feels palpitations at random times. The Toprol seems to be helping though.  Hyperlipidemia  - Atorvastatin has been started by Dr. Drema Dallas. Her total cholesterol used to be in the 240 range. Previously in the 200 range. We discussed. She is not having any side effects. Continue.  One-year follow-up  Signed, Candee Furbish, MD [facility]  02/14/2018 3:29 PM

## 2018-02-15 NOTE — Progress Notes (Signed)
[place]. [address], [place]  [address] Phone: [phone] Fax:  [phone]  Date:  02/16/2018   ID:  JANUS VLCEK, DOB [birth year], MRN [id number]  PCP:  Leighton Ruff, MD   History of Present Illness: ADRIANNA DUDAS is a 62 y.o. female here for follow up of palpitations she has had a history of hypothyroidism. She's noticed skipping of the be on a daily basis. TSH was normal. I started metoprolol 25 mg extended release once a day. This was after her Holter monitor demonstrated 7000 PVCs. Her echocardiogram demonstrated normal ejection fraction with mild MR/TR.she is still stating that she has not had any relief from her symptoms with the metoprolol 25 mg. I increased it to 50 mg a day. I discussed with her that since these PVCs are symptomatic, we could consider antiarrhythmic therapy. She denies any high-risk symptoms such as syncope, chest pain.   Currently she is doing quite well. occasionally she will feel a fluttering sensation but this is quite rare. She did ask if she would need to be on the metoprolol lifelong. We discussed on 02/24/11 that if she is having a period time where the palpitations are decreased significantly, she can try to taper off of the metoprolol as we discussed. I asked her to call me if she does do this for update.  On 09/11/12, she did call for an update on increasing palpitations. She requested that we increase her metoprolol. I obliged and increased to 50 mg. Today she is here for followup.Feels better. Asked about correlation with Vit D?  03/08/13 - Doing well. Rare daily palps. No syncope. On 50mg  Metop.  03/12/14 - Can't remember last palpitations. B12 low, Vit D. doing well. Her father had mitral valve repair by Dr. Roxy Manns on 03/11/14.  03/12/15 - Doing really well. No recent palpitations.   12/19/16  - now on atorvastatin  - was hesitant at first, feels fine on this medication. Occasional palpitations. No chest  pain.  2018-03-06-father died this summer, he was a patient of Dr. Caryl Comes, [place]. Mother is in her late 105s, working towards getting her established with a retirement community.  No chest pain fevers chills nausea vomiting syncope.  Palpitations have not been present.   Wt Readings from Last 3 Encounters:  02/16/18 166 lb 3.2 oz (75.4 kg)  12/19/16 165 lb 9.6 oz (75.1 kg)  07/28/16 166 lb (75.3 kg)     Past Medical History:  Diagnosis Date  . Anosmia    partial   . B12 deficiency   . Frequent PVCs   . Headache    ocassional migraines  . History of hiatal hernia    inguinal hernia  . Hypothyroidism     Past Surgical History:  Procedure Laterality Date  . BREAST BIOPSY Right    benign  . COLONOSCOPY  last in 2015   q 5 years   . TONSILLECTOMY     removed in childhood  . URETHRAL CYST REMOVAL N/A 07/28/2016   Procedure: excision of infected paraurethral cyst;  Surgeon: Carolan Clines, MD;  Location: [facility];  Service: Urology;  Laterality: N/A;    Current Outpatient Medications  Medication Sig Dispense Refill  . alendronate (FOSAMAX) 70 MG tablet Take 70 mg by mouth every Monday.  6  . atorvastatin (LIPITOR) 10 MG tablet Take 10 mg by mouth daily.  2  . Calcium Carb-Cholecalciferol [phone] MG-UNIT CAPS Take 1 tablet by mouth every evening.    Marland Kitchen  Cholecalciferol (VITAMIN D) 2000 units CAPS Take 2,000 Units by mouth every evening.    . Cyanocobalamin (VITAMIN B-12) 2000 MCG TBCR Take 2,000 mcg by mouth every evening.     . metoprolol succinate (TOPROL-XL) 50 MG 24 hr tablet TAKE 1 TABLET BY MOUTH EVERY DAY TAKE WITH OR IMMEDIATELY AFTER A MEAL 90 tablet 0  . SYNTHROID 150 MCG tablet Take 1 tablet by mouth every other day. Alternating with synthroid 175MCG  1  . SYNTHROID 175 MCG tablet Take 175 mcg by mouth every other day. Alternating with synthroid 150MCG  2   No current facility-administered medications for this visit.     Allergies:   No Known Allergies  Social  History:  The patient  reports that she has never smoked. She has never used smokeless tobacco. She reports that she drinks alcohol. She reports that she does not use drugs.   History reviewed. No pertinent family history. Father had MI and WPW. MVR Dr. Roxy Manns. Mother with AFIB  ROS:  Please see the history of present illness.   All other review of systems negative  PHYSICAL EXAM: VS:  BP 114/70   Pulse 67   Ht 5' 6.5" (1.689 m)   Wt 166 lb 3.2 oz (75.4 kg)   LMP  (LMP Unknown)   SpO2 99%   BMI 26.42 kg/m  GEN: Well nourished, well developed, in no acute distress  HEENT: normal  Neck: no JVD, carotid bruits, or masses Cardiac: RRR; no murmurs, rubs, or gallops,no edema  Respiratory:  clear to auscultation bilaterally, normal work of breathing GI: soft, nontender, nondistended, + BS MS: no deformity or atrophy  Skin: warm and dry, no rash Neuro:  Alert and Oriented x 3, Strength and sensation are intact Psych: euthymic mood, full affect    EKG:  Today 02/16/2018-sinus rhythm 67 with no other abnormalities personally viewed-prior 03/12/15 - NSR no changes. Personally viewed. 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 Occasional symptoms. She still feels palpitations at random times. The Toprol seems to be helping though.  No changes made.  Stable.  Hyperlipidemia  - Atorvastatin 10 mg.  Low-dose.  Last LDL 145 11/20/2017, no side effects, no myalgias.   One-year follow-up  Signed, Candee Furbish, MD [facility]  02/16/2018 9:58 AM

## 2018-02-16 ENCOUNTER — Ambulatory Visit (INDEPENDENT_AMBULATORY_CARE_PROVIDER_SITE_OTHER): Payer: 59 | Admitting: Cardiology

## 2018-02-16 ENCOUNTER — Ambulatory Visit: Payer: 59 | Admitting: Cardiology

## 2018-02-16 ENCOUNTER — Encounter: Payer: Self-pay | Admitting: Cardiology

## 2018-02-16 VITALS — BP 114/70 | HR 67 | Ht 66.5 in | Wt 166.2 lb

## 2018-02-16 DIAGNOSIS — I493 Ventricular premature depolarization: Secondary | ICD-10-CM | POA: Diagnosis not present

## 2018-02-16 DIAGNOSIS — R002 Palpitations: Secondary | ICD-10-CM

## 2018-02-16 NOTE — Patient Instructions (Signed)

## 2018-03-20 DIAGNOSIS — Q762 Congenital spondylolisthesis: Secondary | ICD-10-CM | POA: Diagnosis not present

## 2018-03-20 DIAGNOSIS — M4306 Spondylolysis, lumbar region: Secondary | ICD-10-CM | POA: Diagnosis not present

## 2018-03-20 DIAGNOSIS — M48062 Spinal stenosis, lumbar region with neurogenic claudication: Secondary | ICD-10-CM | POA: Diagnosis not present

## 2018-03-27 ENCOUNTER — Other Ambulatory Visit: Payer: Self-pay | Admitting: Cardiology

## 2018-03-30 DIAGNOSIS — E559 Vitamin D deficiency, unspecified: Secondary | ICD-10-CM | POA: Diagnosis not present

## 2018-03-30 DIAGNOSIS — E039 Hypothyroidism, unspecified: Secondary | ICD-10-CM | POA: Diagnosis not present

## 2018-03-30 DIAGNOSIS — Z1159 Encounter for screening for other viral diseases: Secondary | ICD-10-CM | POA: Diagnosis not present

## 2018-03-30 DIAGNOSIS — E7800 Pure hypercholesterolemia, unspecified: Secondary | ICD-10-CM | POA: Diagnosis not present

## 2018-03-30 DIAGNOSIS — E538 Deficiency of other specified B group vitamins: Secondary | ICD-10-CM | POA: Diagnosis not present

## 2018-04-19 DIAGNOSIS — Z23 Encounter for immunization: Secondary | ICD-10-CM | POA: Diagnosis not present

## 2018-05-14 DIAGNOSIS — Z23 Encounter for immunization: Secondary | ICD-10-CM | POA: Diagnosis not present

## 2018-09-20 DIAGNOSIS — E559 Vitamin D deficiency, unspecified: Secondary | ICD-10-CM | POA: Diagnosis not present

## 2018-09-20 DIAGNOSIS — E538 Deficiency of other specified B group vitamins: Secondary | ICD-10-CM | POA: Diagnosis not present

## 2018-09-20 DIAGNOSIS — E7800 Pure hypercholesterolemia, unspecified: Secondary | ICD-10-CM | POA: Diagnosis not present

## 2018-09-20 DIAGNOSIS — E039 Hypothyroidism, unspecified: Secondary | ICD-10-CM | POA: Diagnosis not present

## 2018-09-25 DIAGNOSIS — E559 Vitamin D deficiency, unspecified: Secondary | ICD-10-CM | POA: Diagnosis not present

## 2018-09-25 DIAGNOSIS — E538 Deficiency of other specified B group vitamins: Secondary | ICD-10-CM | POA: Diagnosis not present

## 2018-09-25 DIAGNOSIS — E7800 Pure hypercholesterolemia, unspecified: Secondary | ICD-10-CM | POA: Diagnosis not present

## 2018-11-26 DIAGNOSIS — E039 Hypothyroidism, unspecified: Secondary | ICD-10-CM | POA: Diagnosis not present

## 2018-12-27 ENCOUNTER — Other Ambulatory Visit: Payer: Self-pay | Admitting: Obstetrics and Gynecology

## 2018-12-27 DIAGNOSIS — Z1231 Encounter for screening mammogram for malignant neoplasm of breast: Secondary | ICD-10-CM

## 2019-02-13 ENCOUNTER — Ambulatory Visit
Admission: RE | Admit: 2019-02-13 | Discharge: 2019-02-13 | Disposition: A | Discharge status: Home / Self Care | Payer: BC Managed Care – PPO | Source: Ambulatory Visit | Attending: Obstetrics and Gynecology | Admitting: Obstetrics and Gynecology

## 2019-02-13 ENCOUNTER — Other Ambulatory Visit: Payer: Self-pay

## 2019-02-13 DIAGNOSIS — Z1231 Encounter for screening mammogram for malignant neoplasm of breast: Secondary | ICD-10-CM

## 2019-03-12 DIAGNOSIS — H40012 Open angle with borderline findings, low risk, left eye: Secondary | ICD-10-CM | POA: Diagnosis not present

## 2019-03-19 DIAGNOSIS — Z01419 Encounter for gynecological examination (general) (routine) without abnormal findings: Secondary | ICD-10-CM | POA: Diagnosis not present

## 2019-03-19 DIAGNOSIS — Z6826 Body mass index (BMI) 26.0-26.9, adult: Secondary | ICD-10-CM | POA: Diagnosis not present

## 2019-04-01 DIAGNOSIS — E559 Vitamin D deficiency, unspecified: Secondary | ICD-10-CM | POA: Diagnosis not present

## 2019-04-01 DIAGNOSIS — E7800 Pure hypercholesterolemia, unspecified: Secondary | ICD-10-CM | POA: Diagnosis not present

## 2019-04-01 DIAGNOSIS — E538 Deficiency of other specified B group vitamins: Secondary | ICD-10-CM | POA: Diagnosis not present

## 2019-04-01 DIAGNOSIS — E039 Hypothyroidism, unspecified: Secondary | ICD-10-CM | POA: Diagnosis not present

## 2019-04-23 ENCOUNTER — Other Ambulatory Visit: Payer: Self-pay | Admitting: Cardiology

## 2019-04-23 DIAGNOSIS — Z13820 Encounter for screening for osteoporosis: Secondary | ICD-10-CM | POA: Diagnosis not present

## 2019-04-24 ENCOUNTER — Telehealth: Payer: Self-pay

## 2019-04-24 NOTE — Telephone Encounter (Signed)
Spoke with pt who has agreed to switch appt time from 10:20 a.m to 1:40 pm with Dr. Marlou Porch on 04/25/19. Pt was informed to have vitals ready prior to visit and meds have been reviewed. Pt verbalized understanding and thanked me for the call.     Virtual Visit Pre-Appointment Phone Call  "(Name), I am calling you today to discuss your upcoming appointment. We are currently trying to limit exposure to the virus that causes COVID-19 by seeing patients at home rather than in the office."  1. "What is the BEST phone number to call the day of the visit?" - include this in appointment notes  2. "Do you have or have access to (through a family member/friend) a smartphone with video capability that we can use for your visit?" a. If yes - list this number in appt notes as "cell" (if different from BEST phone #) and list the appointment type as a VIDEO visit in appointment notes b. If no - list the appointment type as a PHONE visit in appointment notes  3. Confirm consent - "In the setting of the current Covid19 crisis, you are scheduled for a (phone or video) visit with your provider on (date) at (time).  Just as we do with many in-office visits, in order for you to participate in this visit, we must obtain consent.  If you'd like, I can send this to your mychart (if signed up) or email for you to review.  Otherwise, I can obtain your verbal consent now.  All virtual visits are billed to your insurance company just like a normal visit would be.  By agreeing to a virtual visit, we'd like you to understand that the technology does not allow for your provider to perform an examination, and thus may limit your provider's ability to fully assess your condition. If your provider identifies any concerns that need to be evaluated in person, we will make arrangements to do so.  Finally, though the technology is pretty good, we cannot assure that it will always work on either your or our end, and in the setting of a video  visit, we may have to convert it to a phone-only visit.  In either situation, we cannot ensure that we have a secure connection.  Are you willing to proceed?" STAFF: Did the patient verbally acknowledge consent to telehealth visit? Document YES/NO here: YES  4. Advise patient to be prepared - "Two hours prior to your appointment, go ahead and check your blood pressure, pulse, oxygen saturation, and your weight (if you have the equipment to check those) and write them all down. When your visit starts, your provider will ask you for this information. If you have an Apple Watch or Kardia device, please plan to have heart rate information ready on the day of your appointment. Please have a pen and paper handy nearby the day of the visit as well."  5. Give patient instructions for MyChart download to smartphone OR Doximity/Doxy.me as below if video visit (depending on what platform provider is using)  6. Inform patient they will receive a phone call 15 minutes prior to their appointment time (may be from unknown caller ID) so they should be prepared to answer    TELEPHONE CALL NOTE  Sherri Villarreal has been deemed a candidate for a follow-up tele-health visit to limit community exposure during the Covid-19 pandemic. I spoke with the patient via phone to ensure availability of phone/video source, confirm preferred email & phone number, and  discuss instructions and expectations.  I reminded Sherri Villarreal to be prepared with any vital sign and/or heart rhythm information that could potentially be obtained via home monitoring, at the time of her visit. I reminded Sherri Villarreal to expect a phone call prior to her visit.  Jacinta Shoe, [place] 04/24/2019 4:48 PM   INSTRUCTIONS FOR DOWNLOADING THE MYCHART APP TO SMARTPHONE  - The patient must first make sure to have activated MyChart and know their login information - If Apple, go to [employer] and type in MyChart in the search bar and download the app.  If Android, ask patient to go to [employer] and type in [place] in the search bar and download the app. The app is free but as with any other app downloads, their phone may require them to verify saved payment information or Apple/Android password.  - The patient will need to then log into the app with their MyChart username and password, and select [facility] as their healthcare provider to link the account. When it is time for your visit, go to the MyChart app, find appointments, and click Begin Video Visit. Be sure to Select Allow for your device to access the Microphone and Camera for your visit. You will then be connected, and your provider will be with you shortly.  **If they have any issues connecting, or need assistance please contact MyChart service desk [phone])**  **If using a computer, in order to ensure the best quality for their visit they will need to use either of the following Internet Browsers: [employer], or Google Chrome**  IF USING DOXIMITY or DOXY.ME - The patient will receive a link just prior to their visit by text.     FULL LENGTH CONSENT FOR TELE-HEALTH VISIT   I hereby voluntarily request, consent and authorize [facility] and its employed or contracted physicians, physician assistants, nurse practitioners or other licensed health care professionals (the Practitioner), to provide me with telemedicine health care services (the "Services") as deemed necessary by the treating Practitioner. I acknowledge and consent to receive the Services by the Practitioner via telemedicine. I understand that the telemedicine visit will involve communicating with the Practitioner through live audiovisual communication technology and the disclosure of certain medical information by electronic transmission. I acknowledge that I have been given the opportunity to request an in-person assessment or other available alternative prior to the telemedicine visit and am  voluntarily participating in the telemedicine visit.  I understand that I have the right to withhold or withdraw my consent to the use of telemedicine in the course of my care at any time, without affecting my right to future care or treatment, and that the Practitioner or I may terminate the telemedicine visit at any time. I understand that I have the right to inspect all information obtained and/or recorded in the course of the telemedicine visit and may receive copies of available information for a reasonable fee.  I understand that some of the potential risks of receiving the Services via telemedicine include:  Marland Kitchen Delay or interruption in medical evaluation due to technological equipment failure or disruption; . Information transmitted may not be sufficient (e.g. poor resolution of images) to allow for appropriate medical decision making by the Practitioner; and/or  . In rare instances, security protocols could fail, causing a breach of personal health information.  Furthermore, I acknowledge that it is my responsibility to provide information about my medical history, conditions and care that is complete  and accurate to the best of my ability. I acknowledge that Practitioner's advice, recommendations, and/or decision may be based on factors not within their control, such as incomplete or inaccurate data provided by me or distortions of diagnostic images or specimens that may result from electronic transmissions. I understand that the practice of medicine is not an exact science and that Practitioner makes no warranties or guarantees regarding treatment outcomes. I acknowledge that I will receive a copy of this consent concurrently upon execution via email to the email address I last provided but may also request a printed copy by calling the office of [facility].    I understand that my insurance will be billed for this visit.   I have read or had this consent read to me. . I understand the  contents of this consent, which adequately explains the benefits and risks of the Services being provided via telemedicine.  . I have been provided ample opportunity to ask questions regarding this consent and the Services and have had my questions answered to my satisfaction. . I give my informed consent for the services to be provided through the use of telemedicine in my medical care  By participating in this telemedicine visit I agree to the above.

## 2019-04-25 ENCOUNTER — Encounter: Payer: Self-pay | Admitting: Cardiology

## 2019-04-25 ENCOUNTER — Other Ambulatory Visit: Payer: Self-pay

## 2019-04-25 ENCOUNTER — Telehealth (INDEPENDENT_AMBULATORY_CARE_PROVIDER_SITE_OTHER): Payer: BC Managed Care – PPO | Admitting: Cardiology

## 2019-04-25 VITALS — Ht 66.5 in | Wt 160.0 lb

## 2019-04-25 DIAGNOSIS — I340 Nonrheumatic mitral (valve) insufficiency: Secondary | ICD-10-CM

## 2019-04-25 DIAGNOSIS — I493 Ventricular premature depolarization: Secondary | ICD-10-CM

## 2019-04-25 DIAGNOSIS — R002 Palpitations: Secondary | ICD-10-CM | POA: Diagnosis not present

## 2019-04-25 DIAGNOSIS — E7800 Pure hypercholesterolemia, unspecified: Secondary | ICD-10-CM

## 2019-04-25 NOTE — Patient Instructions (Signed)
  Medication Instructions:  The current medical regimen is effective;  continue present plan and medications.  If you need a refill on your cardiac medications before your next appointment, please call your pharmacy.   Follow-Up: Follow up in 1 year with Dr. Skains.  You will receive a letter in the mail 2 months before you are due.  Please call us when you receive this letter to schedule your follow up appointment.  Thank you for choosing [facility]!!     

## 2019-04-25 NOTE — Progress Notes (Signed)
Telehealth visit via video secondary to COVID-19 pandemic precautions.  Consent was obtained.  Total time was approximately 13 minutes discussion with patient and review of chart.   [facility] [address], [address], [place]  [address] Phone: [phone] Fax:  [phone]  Date:  04/25/2019   ID:  Sherri Villarreal, DOB 12-12-1955, MRN [id number]  PCP:  Leighton Ruff, MD   History of Present Illness: Sherri Villarreal is a 63 y.o. female here for follow up of palpitations she has had a history of hypothyroidism. She's noticed skipping of the be on a daily basis. TSH was normal. I started metoprolol 25 mg extended release once a day. This was after her Holter monitor demonstrated 7000 PVCs. Her echocardiogram demonstrated normal ejection fraction with mild MR/TR.she is still stating that she has not had any relief from her symptoms with the metoprolol 25 mg. I increased it to 50 mg a day. I discussed with her that since these PVCs are symptomatic, we could consider antiarrhythmic therapy. She denies any high-risk symptoms such as syncope, chest pain.   Currently she is doing quite well. occasionally she will feel a fluttering sensation but this is quite rare. She did ask if she would need to be on the metoprolol lifelong. We discussed on 02/24/11 that if she is having a period time where the palpitations are decreased significantly, she can try to taper off of the metoprolol as we discussed. I asked her to call me if she does do this for update.  On 09/11/12, she did call for an update on increasing palpitations. She requested that we increase her metoprolol. I obliged and increased to 50 mg. Today she is here for followup.Feels better. Asked about correlation with Vit D?  03/08/13 - Doing well. Rare daily palps. No syncope. On 50mg  Metop.  03/12/14 - Can't remember last palpitations. B12 low, Vit D. doing well. Her father had mitral valve repair by Dr. Roxy Manns on 03/11/14.  03/12/15 - Doing really  well. No recent palpitations.   12/19/16  - now on atorvastatin  - was hesitant at first, feels fine on this medication. Occasional palpitations. No chest pain.  03-16-2018-father died this summer, he was a patient of Dr. Caryl Comes, [place]. Mother is in her late 12s, working towards getting her established with a retirement community.  No chest pain fevers chills nausea vomiting syncope.  Palpitations have not been present.  04/25/2019 -he once again for the follow-up of palpitations.  She has been doing very well.  She did have eventful year with her daughter getting married.  Her husband also had knee surgery.  COVID-19.  Feeling well, no significant palpitations.  She is taking her Toprol without any difficulty.  Denies any fevers chills nausea vomiting syncope  Wt Readings from Last 3 Encounters:  04/25/19 160 lb (72.6 kg)  02/16/18 166 lb 3.2 oz (75.4 kg)  12/19/16 165 lb 9.6 oz (75.1 kg)     Past Medical History:  Diagnosis Date  . Anosmia    partial   . B12 deficiency   . Frequent PVCs   . Headache    ocassional migraines  . History of hiatal hernia    inguinal hernia  . Hypothyroidism     Past Surgical History:  Procedure Laterality Date  . BREAST BIOPSY Right    benign  . COLONOSCOPY  last in 2015   q 5 years   . TONSILLECTOMY     removed in childhood  .  URETHRAL CYST REMOVAL N/A 07/28/2016   Procedure: excision of infected paraurethral cyst;  Surgeon: Carolan Clines, MD;  Location: [facility];  Service: Urology;  Laterality: N/A;    Current Outpatient Medications  Medication Sig Dispense Refill  . alendronate (FOSAMAX) 70 MG tablet Take 70 mg by mouth every Monday.  6  . atorvastatin (LIPITOR) 10 MG tablet Take 10 mg by mouth daily.  2  . Calcium Carb-Cholecalciferol [phone] MG-UNIT CAPS Take 1 tablet by mouth every evening.    . Cholecalciferol (VITAMIN D) 2000 units CAPS Take 2,000 Units by mouth every evening.    . Cyanocobalamin (VITAMIN B-12) 2000 MCG TBCR Take 2,000  mcg by mouth every evening.     . metoprolol succinate (TOPROL-XL) 50 MG 24 hr tablet TAKE 1 TAB PO QD, TAKE WITH OR IMMEDIATELY AFTER A MEAL. Pt needs to keep upcoming appt in Oct for further refills 30 tablet 0  . SYNTHROID 150 MCG tablet Take 1 tablet by mouth daily before breakfast. Alternating with synthroid 175MCG  1   No current facility-administered medications for this visit.     Allergies:   No Known Allergies  Social History:  The patient  reports that she has never smoked. She has never used smokeless tobacco. She reports current alcohol use. She reports that she does not use drugs.   No family history on file. Father had MI and WPW. MVR Dr. Roxy Manns. Mother with AFIB  ROS:  Please see the history of present illness.   All other review of systems negative  PHYSICAL EXAM: VS:  Ht 5' 6.5" (1.689 m)   Wt 160 lb (72.6 kg)   LMP  (LMP Unknown)   BMI 25.44 kg/m  BP was 120/70 General she is alert and oriented x3 pleasant moves all extremities, able to complete full senses without difficulty.    EKG:  02/16/2018-sinus rhythm 67 with no other abnormalities personally viewed-prior 03/12/15 - NSR no changes. Personally viewed. 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  No palpitations despite stressful year.  Continue with current plan.  Prior echocardiogram showed normal EF with mild MR and TR.  Previously, murmur was not harsh.   Hyperlipidemia  - Atorvastatin 10 mg.  Low-dose.  Previously LDL 145 11/20/2017, no side effects, no myalgias.  Seems to be tolerating well.  Mild mitral regurgitation and tricuspid regurgitation -At this time we will continue to follow clinically.  No shortness of breath.  No need to repeat echo at this time.  One-year follow-up  Signed, Candee Furbish, MD [place]  04/25/2019 5:37 PM

## 2019-05-19 ENCOUNTER — Other Ambulatory Visit: Payer: Self-pay | Admitting: Cardiology

## 2019-07-04 DIAGNOSIS — E039 Hypothyroidism, unspecified: Secondary | ICD-10-CM | POA: Diagnosis not present

## 2019-07-04 DIAGNOSIS — E538 Deficiency of other specified B group vitamins: Secondary | ICD-10-CM | POA: Diagnosis not present

## 2019-07-04 DIAGNOSIS — E559 Vitamin D deficiency, unspecified: Secondary | ICD-10-CM | POA: Diagnosis not present

## 2020-01-27 ENCOUNTER — Other Ambulatory Visit: Payer: Self-pay | Admitting: Obstetrics and Gynecology

## 2020-01-27 DIAGNOSIS — Z1231 Encounter for screening mammogram for malignant neoplasm of breast: Secondary | ICD-10-CM

## 2020-02-24 ENCOUNTER — Ambulatory Visit
Admission: RE | Admit: 2020-02-24 | Discharge: 2020-02-24 | Disposition: A | Discharge status: Home / Self Care | Payer: Medicare Other | Source: Ambulatory Visit | Attending: Obstetrics and Gynecology | Admitting: Obstetrics and Gynecology

## 2020-02-24 ENCOUNTER — Other Ambulatory Visit: Payer: Self-pay

## 2020-02-24 DIAGNOSIS — Z1231 Encounter for screening mammogram for malignant neoplasm of breast: Secondary | ICD-10-CM

## 2020-04-28 ENCOUNTER — Ambulatory Visit: Payer: Medicare Other | Admitting: Cardiology

## 2020-05-05 ENCOUNTER — Encounter: Payer: Self-pay | Admitting: Cardiology

## 2020-05-05 ENCOUNTER — Other Ambulatory Visit: Payer: Self-pay

## 2020-05-05 ENCOUNTER — Ambulatory Visit (INDEPENDENT_AMBULATORY_CARE_PROVIDER_SITE_OTHER): Payer: Medicare Other | Admitting: Cardiology

## 2020-05-05 VITALS — BP 150/80 | HR 57 | Ht 66.5 in | Wt 170.0 lb

## 2020-05-05 DIAGNOSIS — I340 Nonrheumatic mitral (valve) insufficiency: Secondary | ICD-10-CM | POA: Diagnosis not present

## 2020-05-05 DIAGNOSIS — R002 Palpitations: Secondary | ICD-10-CM

## 2020-05-05 DIAGNOSIS — I493 Ventricular premature depolarization: Secondary | ICD-10-CM

## 2020-05-05 DIAGNOSIS — E7800 Pure hypercholesterolemia, unspecified: Secondary | ICD-10-CM

## 2020-05-05 NOTE — Progress Notes (Signed)
Cardiology Office Note:    Date:  05/05/2020   ID:  Sherri Villarreal, DOB 1955-10-21, MRN [id number]  PCP:  Leighton Ruff, MD  [facility] Cardiologist:  Candee Furbish, MD  [facility] Electrophysiologist:  None   Referring MD: Leighton Ruff, MD     History of Present Illness:    Sherri Villarreal is a 64 y.o. female here for the follow-up of palpitations with prior history of hypothyroidism.  In the past she used to notice skipping on a daily basis. Metoprolol 25 mg was started. She had a Holter monitor that showed over 7000 PVCs. Echocardiogram was normal with normal ejection fraction. I increase the metoprolol to 50 mg after symptoms did not go away.  With the Toprol 50, she is doing very well.  No further palpitations.  In the past year, her husband has retired.  Her mother died.  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  Denies any chest pain fevers chills nausea vomiting syncope bleeding.  Past Medical History:  Diagnosis Date  . Anosmia    partial   . B12 deficiency   . Frequent PVCs   . Headache    ocassional migraines  . History of hiatal hernia    inguinal hernia  . Hypothyroidism     Past Surgical History:  Procedure Laterality Date  . BREAST BIOPSY Right    benign  . COLONOSCOPY  last in 2015   q 5 years   . TONSILLECTOMY     removed in childhood  . URETHRAL CYST REMOVAL N/A 07/28/2016   Procedure: excision of infected paraurethral cyst;  Surgeon: Carolan Clines, MD;  Location: [facility];  Service: Urology;  Laterality: N/A;    Current Medications: Current Meds  Medication Sig  . alendronate (FOSAMAX) 70 MG tablet Take 70 mg by mouth every Monday.  Marland Kitchen atorvastatin (LIPITOR) 10 MG tablet Take 10 mg by mouth daily.  . Calcium Carb-Cholecalciferol [phone] MG-UNIT CAPS Take 1 tablet by mouth every evening.  . Cholecalciferol (VITAMIN D) 2000 units CAPS Take 2,000 Units by mouth every evening.  . Cyanocobalamin (VITAMIN B-12) 2000  MCG TBCR Take 2,000 mcg by mouth every evening.   Marland Kitchen levothyroxine (SYNTHROID) 137 MCG tablet Take 137 mcg by mouth daily.  . metoprolol succinate (TOPROL-XL) 50 MG 24 hr tablet TAKE 1 TABLET BY MOUTH DAILY WITH OR IMMEDIATELY FOLLOWING A MEAL     Allergies:   Patient has no known allergies.   Social History   Socioeconomic History  . Marital status: Married    Spouse name: Not on file  . Number of children: Not on file  . Years of education: Not on file  . Highest education level: Not on file  Occupational History  . Not on file  Tobacco Use  . Smoking status: Never Smoker  . Smokeless tobacco: Never Used  Substance and Sexual Activity  . Alcohol use: Yes    Comment: ocassional   . Drug use: No  . Sexual activity: Not on file  Other Topics Concern  . Not on file  Social History Narrative  . Not on file   Social Determinants of Health   Financial Resource Strain:   . Difficulty of Paying Living Expenses: Not on file  Food Insecurity:   . Worried About Charity fundraiser in the Last Year: Not on file  . Ran Out of Food in the Last Year: Not on file  Transportation Needs:   . Lack of Transportation (Medical):  Not on file  . Lack of Transportation (Non-Medical): Not on file  Physical Activity:   . Days of Exercise per Week: Not on file  . Minutes of Exercise per Session: Not on file  Stress:   . Feeling of Stress : Not on file  Social Connections:   . Frequency of Communication with Friends and Family: Not on file  . Frequency of Social Gatherings with Friends and Family: Not on file  . Attends Religious Services: Not on file  . Active Member of Clubs or Organizations: Not on file  . Attends Archivist Meetings: Not on file  . Marital Status: Not on file    ROS:   Please see the history of present illness.     All other systems reviewed and are negative.  EKGs/Labs/Other Studies Reviewed:   EKG:  EKG is  ordered today.  The ekg ordered today  demonstrates sinus bradycardia 57 with no other abnormalities.  Recent Labs: No results found for requested labs within last 8760 hours.  Recent Lipid Panel No results found for: CHOL, TRIG, HDL, CHOLHDL, VLDL, LDLCALC, LDLDIRECT   Risk Assessment/Calculations:       Physical Exam:    VS:  BP (!) 150/80   Pulse (!) 57   Ht 5' 6.5" (1.689 m)   Wt 170 lb (77.1 kg)   LMP  (LMP Unknown)   SpO2 97%   BMI 27.03 kg/m     Wt Readings from Last 3 Encounters:  05/05/20 170 lb (77.1 kg)  04/25/19 160 lb (72.6 kg)  02/16/18 166 lb 3.2 oz (75.4 kg)     GEN:  Well nourished, well developed in no acute distress HEENT: Normal NECK: No JVD; No carotid bruits LYMPHATICS: No lymphadenopathy CARDIAC: RRR, no murmurs, rubs, gallops RESPIRATORY:  Clear to auscultation without rales, wheezing or rhonchi  ABDOMEN: Soft, non-tender, non-distended MUSCULOSKELETAL:  No edema; No deformity  SKIN: Warm and dry NEUROLOGIC:  Alert and oriented x 3 PSYCHIATRIC:  Normal affect   ASSESSMENT:    1. Palpitations   2. PVC (premature ventricular contraction)   3. Nonrheumatic mitral valve regurgitation   4. Pure hypercholesterolemia    PLAN:    In order of problems listed above:  PVCs/palpitations -Doing very well with medication management with Toprol.  No changes made.  Hyperlipidemia -Continuing with atorvastatin 10 mg without any myalgias.  Doing very well.  She had a prior LDL of 145 in 2019.  Current LDL is 104  Mild mitral regurgitation and tricuspid vegetation -We will follow clinically.  No significant murmur heard on exam.   Medication Adjustments/Labs and Tests Ordered: Current medicines are reviewed at length with the patient today.  Concerns regarding medicines are outlined above.  Orders Placed This Encounter  Procedures  . EKG 12-Lead   No orders of the defined types were placed in this encounter.   Patient Instructions  Medication Instructions:  Your physician  recommends that you continue on your current medications as directed. Please refer to the Current Medication list given to you today. *If you need a refill on your cardiac medications before your next appointment, please call your pharmacy*   Lab Work: None If you have labs (blood work) drawn today and your tests are completely normal, you will receive your results only by: Marland Kitchen MyChart Message (if you have MyChart) OR . A paper copy in the mail If you have any lab test that is abnormal or we need to change your treatment, we  will call you to review the results.   Testing/Procedures: None   Follow-Up: At [facility], you and your health needs are our priority.  As part of our continuing mission to provide you with exceptional heart care, we have created designated Provider Care Teams.  These Care Teams include your primary Cardiologist (physician) and Advanced Practice Providers (APPs -  Physician Assistants and Nurse Practitioners) who all work together to provide you with the care you need, when you need it.  We recommend signing up for the patient portal called "MyChart".  Sign up information is provided on this After Visit Summary.  MyChart is used to connect with patients for Virtual Visits (Telemedicine).  Patients are able to view lab/test results, encounter notes, upcoming appointments, etc.  Non-urgent messages can be sent to your provider as well.   To learn more about what you can do with MyChart, go to [email or web address].    Your next appointment:   12 month(s)  The format for your next appointment:   In Person  Provider:   Candee Furbish, MD       Signed, Candee Furbish, MD  05/05/2020 5:40 PM    [place]

## 2020-05-05 NOTE — Patient Instructions (Signed)
Medication Instructions:  Your physician recommends that you continue on your current medications as directed. Please refer to the Current Medication list given to you today. *If you need a refill on your cardiac medications before your next appointment, please call your pharmacy*   Lab Work: None If you have labs (blood work) drawn today and your tests are completely normal, you will receive your results only by: Marland Kitchen MyChart Message (if you have MyChart) OR . A paper copy in the mail If you have any lab test that is abnormal or we need to change your treatment, we will call you to review the results.   Testing/Procedures: None   Follow-Up: At [facility], you and your health needs are our priority.  As part of our continuing mission to provide you with exceptional heart care, we have created designated Provider Care Teams.  These Care Teams include your primary Cardiologist (physician) and Advanced Practice Providers (APPs -  Physician Assistants and Nurse Practitioners) who all work together to provide you with the care you need, when you need it.  We recommend signing up for the patient portal called "MyChart".  Sign up information is provided on this After Visit Summary.  MyChart is used to connect with patients for Virtual Visits (Telemedicine).  Patients are able to view lab/test results, encounter notes, upcoming appointments, etc.  Non-urgent messages can be sent to your provider as well.   To learn more about what you can do with MyChart, go to [email or web address].    Your next appointment:   12 month(s)  The format for your next appointment:   In Person  Provider:   Candee Furbish, MD

## 2020-06-30 ENCOUNTER — Other Ambulatory Visit: Payer: Self-pay | Admitting: Cardiology

## 2021-04-16 ENCOUNTER — Other Ambulatory Visit: Payer: Self-pay | Admitting: Surgery

## 2021-04-16 DIAGNOSIS — N6002 Solitary cyst of left breast: Secondary | ICD-10-CM

## 2021-04-19 ENCOUNTER — Other Ambulatory Visit: Payer: Self-pay | Admitting: Surgery

## 2021-04-19 DIAGNOSIS — N6002 Solitary cyst of left breast: Secondary | ICD-10-CM

## 2021-04-22 ENCOUNTER — Other Ambulatory Visit: Payer: Self-pay | Admitting: Surgery

## 2021-04-22 ENCOUNTER — Ambulatory Visit
Admission: RE | Admit: 2021-04-22 | Discharge: 2021-04-22 | Disposition: A | Discharge status: Home / Self Care | Payer: Medicare Other | Source: Ambulatory Visit | Attending: Surgery | Admitting: Surgery

## 2021-04-22 ENCOUNTER — Other Ambulatory Visit: Payer: Self-pay

## 2021-04-22 ENCOUNTER — Ambulatory Visit
Admission: RE | Admit: 2021-04-22 | Discharge: 2021-04-22 | Disposition: A | Discharge status: Home / Self Care | Payer: Medicare Other | Source: Ambulatory Visit

## 2021-04-22 DIAGNOSIS — N6002 Solitary cyst of left breast: Secondary | ICD-10-CM

## 2021-04-22 DIAGNOSIS — R928 Other abnormal and inconclusive findings on diagnostic imaging of breast: Secondary | ICD-10-CM

## 2021-05-17 NOTE — Progress Notes (Signed)
Cardiology Office Note    Date:  05/19/2021   ID:  Sherri Villarreal, DOB 06-09-1956, MRN [id number]  PCP:  Marda Stalker, PA-C  Cardiologist:  Candee Furbish, MD  Electrophysiologist:  None   Chief Complaint: f/u PVCs  History of Present Illness:   Sherri Villarreal is a 65 y.o. 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 She has followed with Dr. Marlou Porch for history of palpitations with prior Holter showing over 7000 PVCs. 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 2D Echo 2011 showed EF 55-60%, mild MR/TR. She has been managed with beta blocker therapy. She reports that at last OV she had had a discussion with Dr. Marlou Porch about tapering down BB if she wanted to. I do not see this outlined in the notes but she reduced her metoprolol in 1/2 for a few months then completely stopped a while back. She has not had any recurrent palpitations. No CP, dyspnea, headaches, dizziness, syncope. Her BP is running high today. She states she had a breast cyst aspirated 3 weeks ago and reports perfect blood pressure at that time, 120/78. She has lost about 15 lb since March which was somewhat intention abut she feels she lost more than expected. Lipids are followed by primary care.   Labwork independently reviewed: KPN 3/202 LDL 97, trig 78, Hgb 12.5, Cr 0.760, K 5.1, ALT and TSH wnl 2018 CBC wnl, K 4.7, Cr 0.63   Past Medical History:  Diagnosis Date   Anosmia    partial    B12 deficiency    Frequent PVCs    Headache    ocassional migraines   History of hiatal hernia    inguinal hernia   Hypothyroidism     Past Surgical History:  Procedure Laterality Date   BREAST BIOPSY Right    benign   BREAST CYST ASPIRATION     COLONOSCOPY  last in 2015   q 5 years    TONSILLECTOMY     removed in childhood   URETHRAL  CYST REMOVAL N/A 07/28/2016   Procedure: excision of infected paraurethral cyst;  Surgeon: Carolan Clines, MD;  Location: [facility];  Service: Urology;  Laterality: N/A;    Current Medications: Current Meds  Medication Sig   alendronate (FOSAMAX) 70 MG tablet Take 70 mg by mouth every Monday.   atorvastatin (LIPITOR) 10 MG tablet Take 10 mg by mouth daily.   Calcium Carb-Cholecalciferol [phone] MG-UNIT CAPS Take 1 tablet by mouth every evening.   Cholecalciferol (VITAMIN D) 2000 units CAPS Take 2,000 Units by mouth every evening.   Cyanocobalamin (VITAMIN B-12) 2000 MCG TBCR Take 2,000 mcg by mouth every evening.    levothyroxine (SYNTHROID) 137 MCG tablet Take 137 mcg by mouth daily.      Allergies:   Patient has no known allergies.   Social History   Socioeconomic History   Marital status: Married    Spouse name: Not on file   Number of children: Not on file   Years of education: Not on file   Highest education level: Not on file  Occupational History   Not on file  Tobacco Use   Smoking status: Never   Smokeless tobacco: Never  Substance and Sexual Activity   Alcohol use: Yes    Comment: ocassional    Drug use: No   Sexual  activity: Not on file  Other Topics Concern   Not on file  Social History Narrative   Not on file   Social Determinants of Health   Financial Resource Strain: Not on file  Food Insecurity: Not on file  Transportation Needs: Not on file  Physical Activity: Not on file  Stress: Not on file  Social Connections: Not on file     Family History:  The patient's family history includes Atrial fibrillation in her father; Congestive Heart Failure in her father; Yves Dill Parkinson White syndrome in her father.  ROS:   Please see the history of present illness.  All other systems are reviewed and otherwise negative.    EKGs/Labs/Other Studies Reviewed:    Studies reviewed are outlined and summarized above. Reports included below if  pertinent.  2D echo 2011 - see scanned report    EKG:  EKG is ordered today, personally reviewed, demonstrating NSR 74bpm, no acute STT changes. No significant change from prior.  Recent Labs: No results found for requested labs within last 8760 hours.  Recent Lipid Panel No results found for: CHOL, TRIG, HDL, CHOLHDL, VLDL, LDLCALC, LDLDIRECT  PHYSICAL EXAM:    VS:  BP (!) 171/100   Pulse 74   Ht 5' 6.5" (1.689 m)   Wt 161 lb 9.6 oz (73.3 kg)   LMP  (LMP Unknown)   SpO2 99%   BMI 25.69 kg/m   BMI: Body mass index is 25.69 kg/m.  GEN: Well nourished, well developed female in no acute distress HEENT: normocephalic, atraumatic Neck: no JVD, carotid bruits, or masses Cardiac: RRR; no murmurs, rubs, or gallops, no edema  Respiratory:  clear to auscultation bilaterally, normal work of breathing GI: soft, nontender, nondistended, + BS MS: no deformity or atrophy Skin: warm and dry, no rash Neuro:  Alert and Oriented x 3, Strength and sensation are intact, follows commands Psych: euthymic mood, full affect  Wt Readings from Last 3 Encounters:  05/19/21 161 lb 9.6 oz (73.3 kg)  05/05/20 170 lb (77.1 kg)  04/25/19 160 lb (72.6 kg)     ASSESSMENT & PLAN:   1. PVCs - remote diagnosis manifested by palpitations which have resolved. She has weaned herself off metoprolol since she is no longer symptomatic. EKG shows NSR without any ectopy. Continue to follow for any recurrent symptoms. Family history of WPW noted. Her EKG shows normal PR and QRS duration arguing against pre-excitation.   2. Hypothyroidism - she describes some weight loss, somewhat intentional but also easier than anticipated. Check thyroid today.  3. Mild MR/TR - guidelines suggest having a surveillance echo q3-5 years. Last study 2011. Will update for surveillance.   4. Elevated BP without formal dx of HTN - BP elevated at 170/100 by initial check. Recheck by me was 165/90. No formal hx of HTN that I can see in  our notes but she has had intermittent elevation in BPs over the years. She reports her BP was totally normal 3 weeks ago. She is no longer on metoprolol which, although not typically having a large impact on BP, may have been controlling some of her elevation in the past. We need a better idea of what it's running at home. The patient was provided instructions on monitoring blood pressure at home for the next few days and relaying results to our office on Friday. Check CBC, BMET, TSH today as well. Could consider initiation of carvedilol or thiazide if needed. Would avoid ACEI/ARB/spiro given tendency for upper-normal potassium level.  Disposition: Will tentatively request f/u with Dr. Marlou Porch in 1 year. If she requires additional BP management as above, we may request sooner follow-up or offer for her to f/u with PCP.   Medication Adjustments/Labs and Tests Ordered: Current medicines are reviewed at length with the patient today.  Concerns regarding medicines are outlined above. Medication changes, Labs and Tests ordered today are summarized above and listed in the Patient Instructions accessible in Encounters.   Signed, Charlie Pitter, PA-C  05/19/2021 8:16 AM    [facility] [address], [place], [place]  [address] Phone: [phone]; Fax: [phone]

## 2021-05-18 ENCOUNTER — Ambulatory Visit: Payer: Medicare Other | Admitting: Cardiology

## 2021-05-19 ENCOUNTER — Ambulatory Visit (INDEPENDENT_AMBULATORY_CARE_PROVIDER_SITE_OTHER): Payer: Medicare Other | Admitting: Physician Assistant

## 2021-05-19 ENCOUNTER — Other Ambulatory Visit: Payer: Self-pay

## 2021-05-19 ENCOUNTER — Encounter: Payer: Self-pay | Admitting: Physician Assistant

## 2021-05-19 VITALS — BP 171/100 | HR 74 | Ht 66.5 in | Wt 161.6 lb

## 2021-05-19 DIAGNOSIS — I071 Rheumatic tricuspid insufficiency: Secondary | ICD-10-CM

## 2021-05-19 DIAGNOSIS — I493 Ventricular premature depolarization: Secondary | ICD-10-CM

## 2021-05-19 DIAGNOSIS — E039 Hypothyroidism, unspecified: Secondary | ICD-10-CM | POA: Diagnosis not present

## 2021-05-19 DIAGNOSIS — I340 Nonrheumatic mitral (valve) insufficiency: Secondary | ICD-10-CM | POA: Diagnosis not present

## 2021-05-19 DIAGNOSIS — R030 Elevated blood-pressure reading, without diagnosis of hypertension: Secondary | ICD-10-CM

## 2021-05-19 LAB — CBC
Hematocrit: 37.9 % (ref 34.0–46.6)
Hemoglobin: 13.4 g/dL (ref 11.1–15.9)
MCH: 31.3 pg (ref 26.6–33.0)
MCHC: 35.4 g/dL (ref 31.5–35.7)
MCV: 89 fL (ref 79–97)
Platelets: 222 10*3/uL (ref 150–450)
RBC: 4.28 x10E6/uL (ref 3.77–5.28)
RDW: 12 % (ref 11.7–15.4)
WBC: 4.6 10*3/uL (ref 3.4–10.8)

## 2021-05-19 LAB — BASIC METABOLIC PANEL
BUN/Creatinine Ratio: 27 (ref 12–28)
BUN: 23 mg/dL (ref 8–27)
CO2: 27 mmol/L (ref 20–29)
Calcium: 9.2 mg/dL (ref 8.7–10.3)
Chloride: 102 mmol/L (ref 96–106)
Creatinine, Ser: 0.86 mg/dL (ref 0.57–1.00)
Glucose: 94 mg/dL (ref 70–99)
Potassium: 4.1 mmol/L (ref 3.5–5.2)
Sodium: 141 mmol/L (ref 134–144)
eGFR: 74 mL/min/{1.73_m2} (ref >59)

## 2021-05-19 LAB — TSH: TSH: 0.341 u[IU]/mL — ABNORMAL LOW (ref 0.450–4.500)

## 2021-05-19 NOTE — Patient Instructions (Addendum)
Medication Instructions:  Your physician recommends that you continue on your current medications as directed. Please refer to the Current Medication list given to you today.  *If you need a refill on your cardiac medications before your next appointment, please call your pharmacy*   Lab Work: TODAY:  BMET, CBC, & TSH  If you have labs (blood work) drawn today and your tests are completely normal, you will receive your results only by: [place] (if you have MyChart) OR A paper copy in the mail If you have any lab test that is abnormal or we need to change your treatment, we will call you to review the results.   Testing/Procedures: Your physician has requested that you have an echocardiogram. Echocardiography is a painless test that uses sound waves to create images of your heart. It provides your doctor with information about the size and shape of your heart and how well your heart's chambers and valves are working. This procedure takes approximately one hour. There are no restrictions for this procedure.    Follow-Up: At [facility], you and your health needs are our priority.  As part of our continuing mission to provide you with exceptional heart care, we have created designated Provider Care Teams.  These Care Teams include your primary Cardiologist (physician) and Advanced Practice Providers (APPs -  Physician Assistants and Nurse Practitioners) who all work together to provide you with the care you need, when you need it.  We recommend signing up for the patient portal called "MyChart".  Sign up information is provided on this After Visit Summary.  MyChart is used to connect with patients for Virtual Visits (Telemedicine).  Patients are able to view lab/test results, encounter notes, upcoming appointments, etc.  Non-urgent messages can be sent to your provider as well.   To learn more about what you can do with MyChart, go to [email or web address].    Your next  appointment:   1 year(s)  The format for your next appointment:   In Person  Provider:   Candee Furbish, MD or Melina Copa, PA-C   Other Instructions We need to get a better idea of what your blood pressure is running at home. Here are some instructions to follow: - To check your blood pressure, choose a time at least 3 hours after taking your blood pressure medicines. If you can sample it at different times of the day, that's great - it might give you more information about how your blood pressure fluctuates. Remain seated in a chair for 5 minutes quietly beforehand, then check it.  - Please record a list of those readings and call us/send in MyChart message with them for our review on Friday 05/22/21.

## 2021-06-25 ENCOUNTER — Other Ambulatory Visit: Payer: Self-pay

## 2021-06-25 ENCOUNTER — Ambulatory Visit (HOSPITAL_COMMUNITY): Payer: Medicare Other | Attending: Internal Medicine

## 2021-06-25 DIAGNOSIS — I071 Rheumatic tricuspid insufficiency: Secondary | ICD-10-CM

## 2021-06-25 DIAGNOSIS — R030 Elevated blood-pressure reading, without diagnosis of hypertension: Secondary | ICD-10-CM | POA: Diagnosis present

## 2021-06-25 DIAGNOSIS — I340 Nonrheumatic mitral (valve) insufficiency: Secondary | ICD-10-CM | POA: Diagnosis present

## 2021-06-25 DIAGNOSIS — I493 Ventricular premature depolarization: Secondary | ICD-10-CM | POA: Diagnosis present

## 2021-06-25 DIAGNOSIS — E039 Hypothyroidism, unspecified: Secondary | ICD-10-CM

## 2021-06-25 LAB — ECHOCARDIOGRAM COMPLETE
Area-P 1/2: 3.27 cm2
S' Lateral: 2.7 cm

## 2022-04-19 IMAGING — MG DIGITAL DIAGNOSTIC BILAT W/ TOMO W/ CAD
6 of 10 series · 6 of 30 positions shown · non-contrast
Comparison: Previous exam(s).

CLINICAL DATA: 67-year-old female for further evaluation of
palpable tender LEFT breast mass which was reportedly recently
partially drained. Also for annual bilateral mammogram.



[R MLO synth-2D]
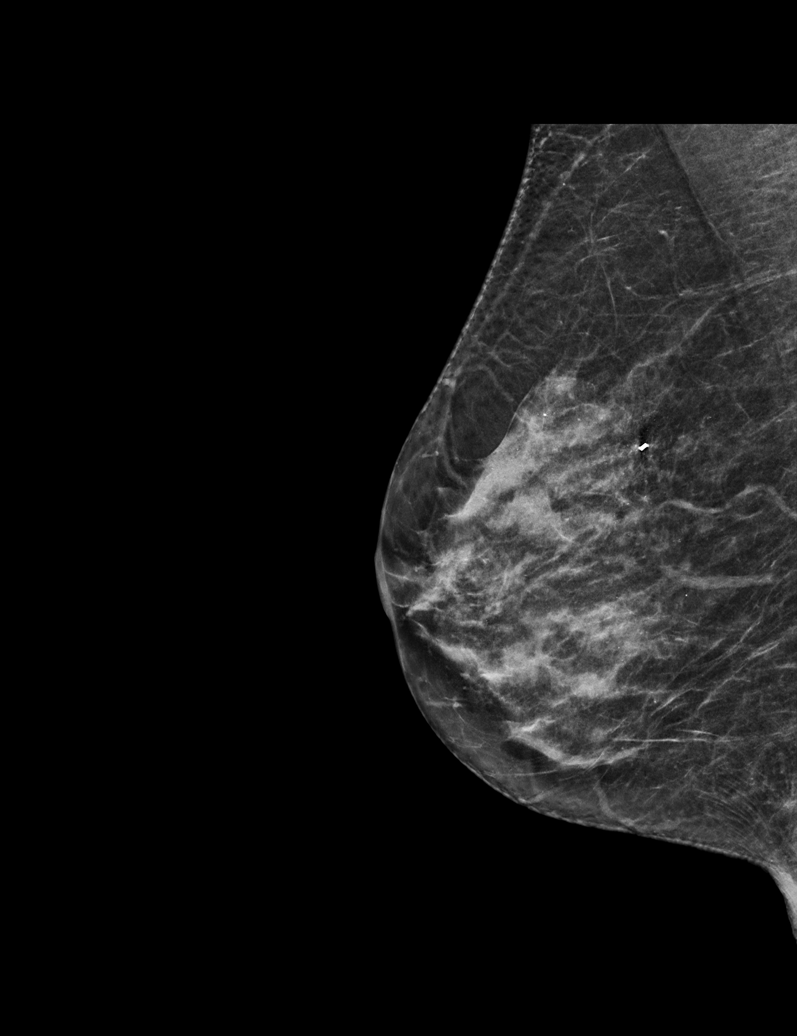

[L TAN synth-2D]
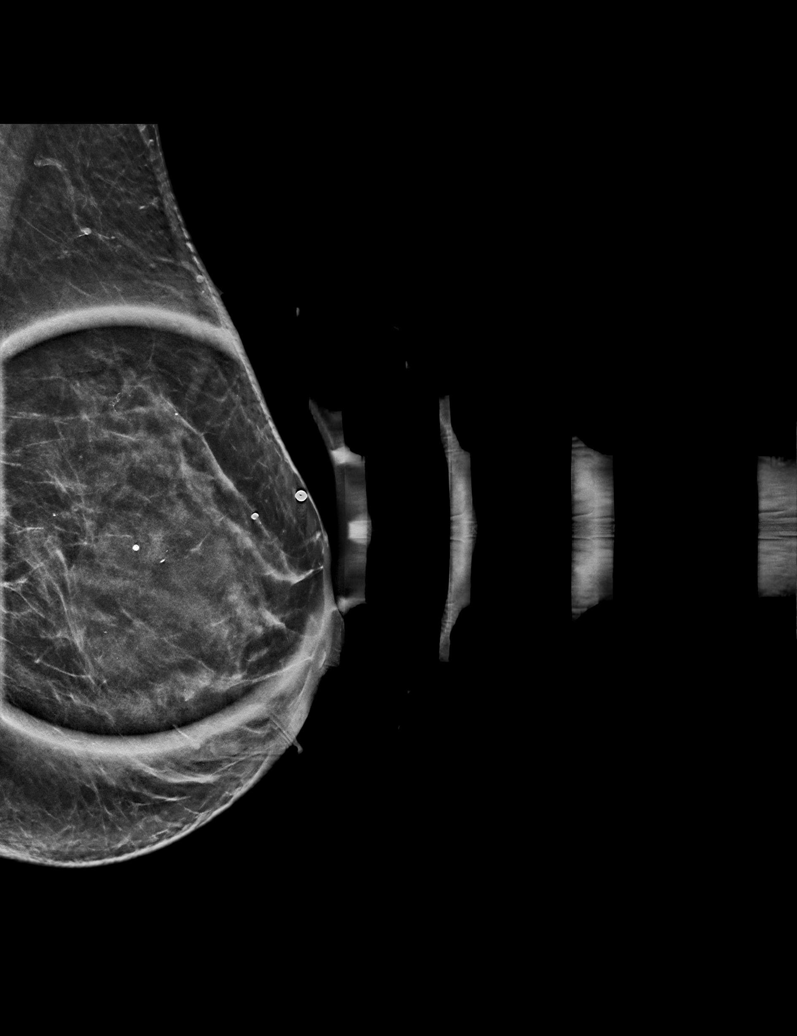

[L MLO synth-2D]
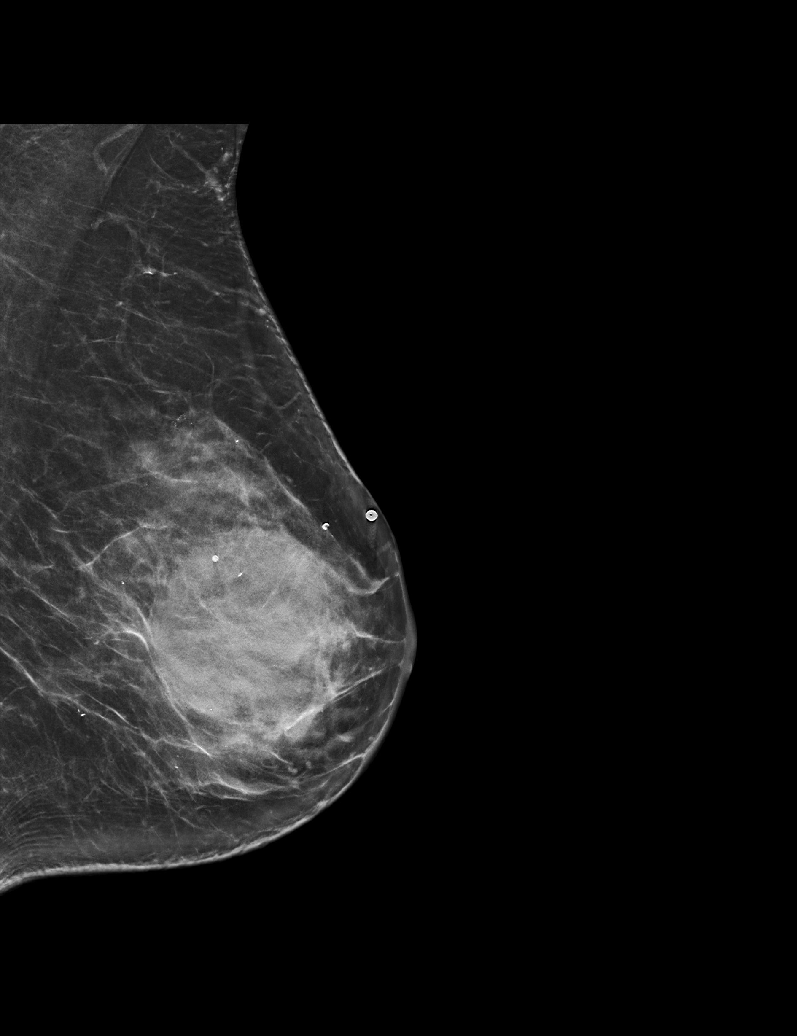

[L CC synth-2D]
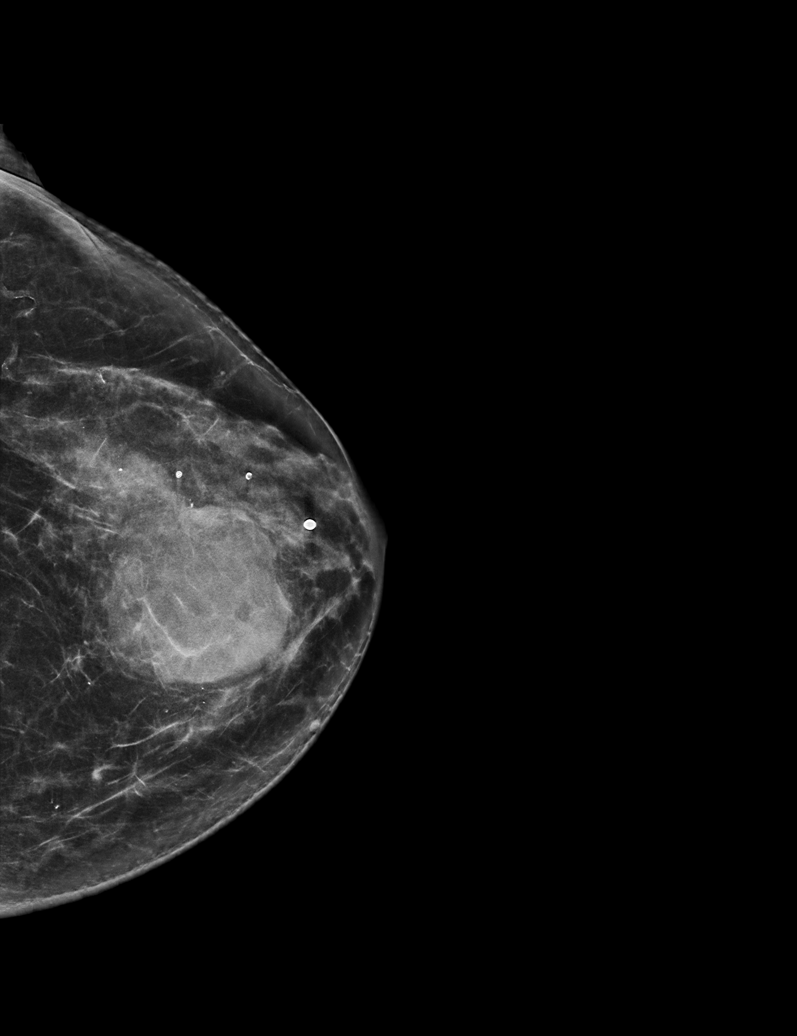

[R CC synth-2D]
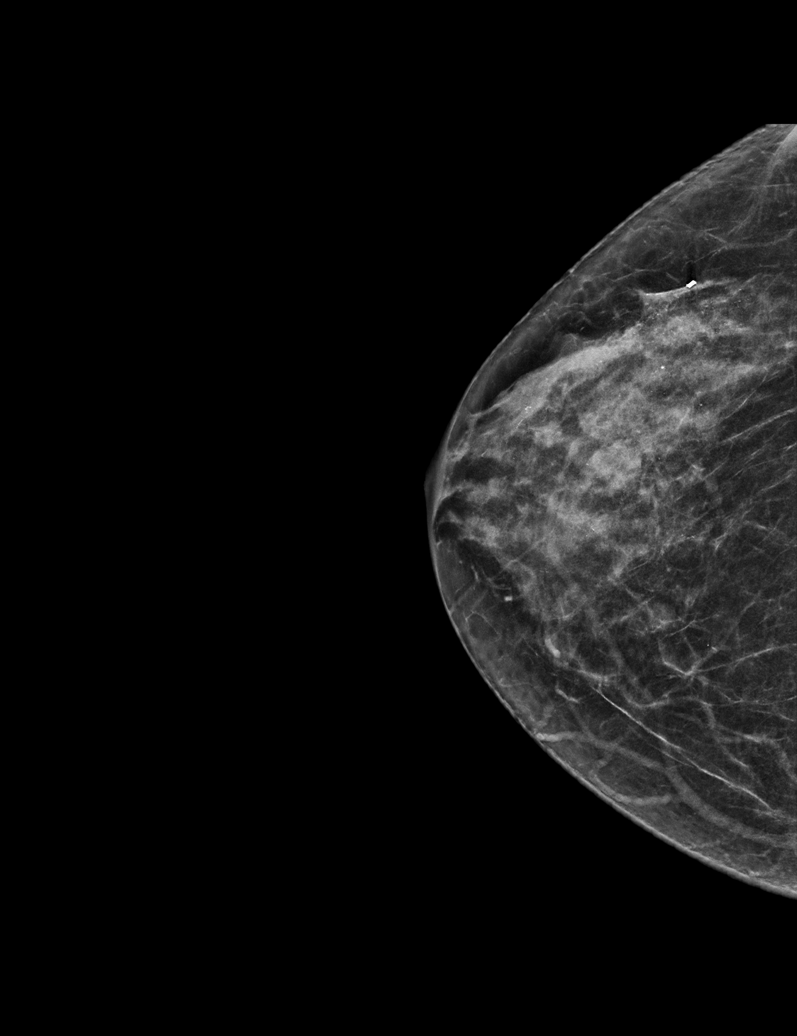

[R CC tomo · tomo slice 27/52.0]
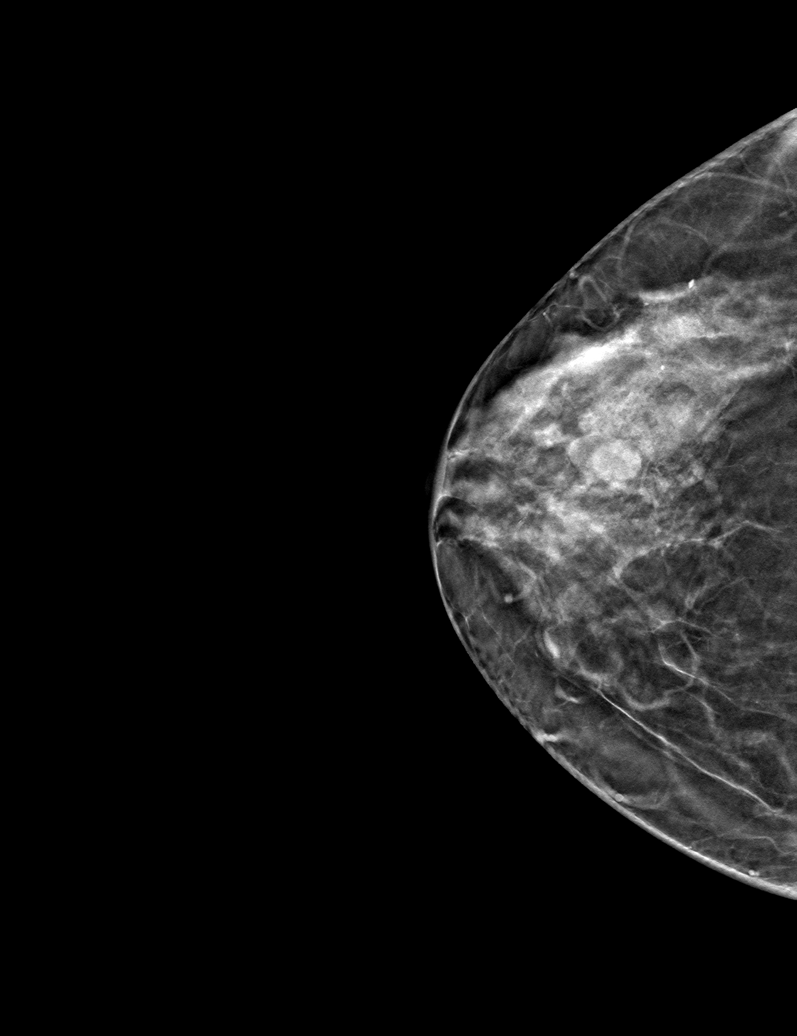

[6 of 30 positions shown; findings below may reference images not displayed]

ACR Breast Density Category c: The breast tissue is heterogeneously
dense, which may obscure small masses.
FINDINGS: 2D/3D full field views of both breasts demonstrate a circumscribed
oval mass within the central/RETROAREOLAR LEFT breast.

Circumscribed oval masses within the slightly OUTER RIGHT breast are
present.

CYLINDER biopsy clip within the UPPER-OUTER RIGHT breast is again
noted.

No other significant abnormalities are noted within either breast.

On physical exam, a very firm palpable RETROAREOLAR LEFT breast mass
identified.

Targeted ultrasound is performed, showing the following

RIGHT breast:

Multiple benign simple and minimally complicated cysts within the
RIGHT breast. A 1.4 x 0.7 x 1.5 cm cyst at the 7 o'clock position 3
cm from the nipple and a 0.7 x 0.6 x 0.7 cm cyst at the 9 o'clock
position 2 cm from the nipple account for the 2 masses identified
mammographically.

LEFT breast:

A 3.5 x 2.4 x 3.4 cm benign complicated cyst in the RETROAREOLAR
LEFT breast is identified.
IMPRESSION: 1. 3.5 cm benign complicated cyst in the RETROAREOLAR LEFT breast
accounting for the patient's palpable abnormality. The patient
desires aspiration due to tenderness, which will be performed today
but dictated in a separate report.
2. Benign cysts within the RIGHT breast.
3. No evidence of breast malignancy.

RECOMMENDATION:
1. Ultrasound-guided LEFT breast aspiration, which will be performed
today but dictated in a separate report.
2. Bilateral screening mammogram in 1 year.

I have discussed the findings and recommendations with the patient.
If applicable, a reminder letter will be sent to the patient
regarding the next appointment.

BI-RADS CATEGORY  2: Benign.

## 2022-05-13 ENCOUNTER — Other Ambulatory Visit: Payer: Self-pay | Admitting: Obstetrics and Gynecology

## 2022-05-13 DIAGNOSIS — Z1231 Encounter for screening mammogram for malignant neoplasm of breast: Secondary | ICD-10-CM

## 2022-06-02 ENCOUNTER — Ambulatory Visit
Admission: RE | Admit: 2022-06-02 | Discharge: 2022-06-02 | Disposition: A | Discharge status: Home / Self Care | Payer: Medicare Other | Source: Ambulatory Visit | Attending: Obstetrics and Gynecology | Admitting: Obstetrics and Gynecology

## 2022-06-02 DIAGNOSIS — Z1231 Encounter for screening mammogram for malignant neoplasm of breast: Secondary | ICD-10-CM

## 2022-11-30 ENCOUNTER — Ambulatory Visit: Payer: Medicare Other | Attending: Cardiology | Admitting: Cardiology

## 2022-11-30 ENCOUNTER — Encounter: Payer: Self-pay | Admitting: Cardiology

## 2022-11-30 VITALS — BP 134/80 | HR 72 | Ht 66.5 in | Wt 168.0 lb

## 2022-11-30 DIAGNOSIS — R002 Palpitations: Secondary | ICD-10-CM | POA: Diagnosis present

## 2022-11-30 DIAGNOSIS — I340 Nonrheumatic mitral (valve) insufficiency: Secondary | ICD-10-CM | POA: Diagnosis present

## 2022-11-30 DIAGNOSIS — I071 Rheumatic tricuspid insufficiency: Secondary | ICD-10-CM | POA: Insufficient documentation

## 2022-11-30 DIAGNOSIS — I493 Ventricular premature depolarization: Secondary | ICD-10-CM | POA: Diagnosis present

## 2022-11-30 DIAGNOSIS — E039 Hypothyroidism, unspecified: Secondary | ICD-10-CM | POA: Insufficient documentation

## 2022-11-30 NOTE — Progress Notes (Signed)
Cardiology Office Note    Date:  11/30/2022   ID:  Sherri Villarreal, DOB 22-May-1956, MRN [id number]  PCP:  Jarrett Soho, PA-C  Cardiologist:  Donato Schultz, MD  Electrophysiologist:  None   Chief Complaint: f/u PVCs  History of Present Illness:   Sherri Villarreal is a 67 y.o. female here for the follow-up of prior palpitations PVCs mild mitral regurgitation.  Over the past 6 months she has been off of the metoprolol.  She is doing quite well with this.  No significant palpitations.  She was diagnosed with uveitis in 2024 and she has been taking Humira as well as azathioprine.  She will need this for 2 years straight.  Saw a retinal specialist.  Prior Holter monitor showed 7000 PVCs.  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  Echocardiogram as below.  Normal pump function, mild mitral regurgitation.  Eating well.  No chest pain fevers chills nausea vomiting.  She did stop taking her atorvastatin at 1 point and her LDL was 173.  She is now back on.  Labwork independently reviewed: KPN 3/202 LDL 97, trig 78, Hgb 12.5, Cr 0.760, K 5.1, ALT and TSH wnl 2018 CBC wnl, K 4.7, Cr 0.63   Past Medical History:  Diagnosis Date   Anosmia    partial    B12 deficiency    Frequent PVCs    Headache    ocassional migraines   History of hiatal hernia    inguinal hernia   Hypothyroidism     Past Surgical History:  Procedure Laterality Date   BREAST BIOPSY Right    benign   BREAST CYST ASPIRATION     COLONOSCOPY  last in 2015   q 5 years    TONSILLECTOMY     removed in childhood   URETHRAL CYST REMOVAL N/A 07/28/2016   Procedure: excision of infected paraurethral cyst;  Surgeon: Jethro Bolus, MD;  Location: [facility];  Service: Urology;  Laterality: N/A;    Current Medications: Current Meds  Medication Sig   Adalimumab 40 MG/0.4ML PNKT Inject 0.4 mLs into the skin every 14 (fourteen) days.   alendronate (FOSAMAX) 70 MG tablet Take 70 mg by mouth every  Monday.   atorvastatin (LIPITOR) 10 MG tablet Take 10 mg by mouth daily.   Calcium Carb-Cholecalciferol [phone] MG-UNIT CAPS Take 1 tablet by mouth every evening.   Cholecalciferol (VITAMIN D) 2000 units CAPS Take 2,000 Units by mouth every evening.   Cyanocobalamin (VITAMIN B-12) 2000 MCG TBCR Take 2,000 mcg by mouth every evening.    levothyroxine (SYNTHROID) 137 MCG tablet Take 137 mcg by mouth daily.      Allergies:   Patient has no known allergies.   Social History   Socioeconomic History   Marital status: Married    Spouse name: Not on file   Number of children: Not on file   Years of education: Not on file   Highest education level: Not on file  Occupational History   Not on file  Tobacco Use   Smoking status: Never   Smokeless tobacco: Never  Substance and Sexual Activity   Alcohol use: Yes    Comment: ocassional    Drug use: No   Sexual activity: Not on file  Other Topics Concern   Not on file  Social History Narrative   Not on file   Social Determinants of Health   Financial Resource Strain: Not on file  Food Insecurity: Not on file  Transportation  Needs: Not on file  Physical Activity: Not on file  Stress: Not on file  Social Connections: Not on file     Family History:  The patient's family history includes Atrial fibrillation in her father; Congestive Heart Failure in her father; Evelene Croon Parkinson White syndrome in her father.  ROS:   Please see the history of present illness.  All other systems are reviewed and otherwise negative.    EKGs/Labs/Other Studies Reviewed:    Studies reviewed are outlined and summarized above. Reports included below if pertinent. Cardiac Studies & Procedures       ECHOCARDIOGRAM  ECHOCARDIOGRAM COMPLETE 06/25/2021  Narrative ECHOCARDIOGRAM REPORT    Patient Name:   Sherri Villarreal [facility] Date of Exam: 06/25/2021 Medical Rec #:  [id number]        Height:       66.5 in Accession #:    [id number]       Weight:        161.6 lb Date of Birth:  [birth year]         BSA:          1.837 m Patient Age:    65 years         BP:           177/74 mmHg Patient Gender: F                HR:           69 bpm. Exam Location:  [address]  Procedure: 2D Echo, 3D Echo, Cardiac Doppler, Color Doppler and Strain Analysis  Indications:    I34.0 Mitral Valve insufficiency  History:        Patient has prior history of Echocardiogram examinations, most recent 06/21/2010. Arrythmias:PVC.  Sonographer:    Sedonia Small Rodgers-Jones RDCS Referring Phys: 33 DAYNA N DUNN  IMPRESSIONS   1. Global longitudinal strain is -25.7%. Left ventricular ejection fraction, by estimation, is 65 to 70%. The left ventricle has normal function. The left ventricle has no regional wall motion abnormalities. Left ventricular diastolic parameters were normal. 2. Right ventricular systolic function is normal. The right ventricular size is normal. 3. The mitral valve is normal in structure. Mild mitral valve regurgitation. 4. The aortic valve is tricuspid. Aortic valve regurgitation is not visualized. Aortic valve sclerosis is present, with no evidence of aortic valve stenosis. 5. The inferior vena cava is normal in size with greater than 50% respiratory variability, suggesting right atrial pressure of 3 mmHg.  FINDINGS Left Ventricle: Global longitudinal strain is -25.7%. Left ventricular ejection fraction, by estimation, is 65 to 70%. The left ventricle has normal function. The left ventricle has no regional wall motion abnormalities. The left ventricular internal cavity size was normal in size. There is no left ventricular hypertrophy. Left ventricular diastolic parameters were normal.  Right Ventricle: The right ventricular size is normal. Right vetricular wall thickness was not assessed. Right ventricular systolic function is normal.  Left Atrium: Left atrial size was normal in size.  Right Atrium: Right atrial size was normal in  size.  Pericardium: There is no evidence of pericardial effusion.  Mitral Valve: The mitral valve is normal in structure. Mild mitral valve regurgitation.  Tricuspid Valve: The tricuspid valve is normal in structure. Tricuspid valve regurgitation is trivial.  Aortic Valve: The aortic valve is tricuspid. Aortic valve regurgitation is not visualized. Aortic valve sclerosis is present, with no evidence of aortic valve stenosis.  Pulmonic Valve: The pulmonic valve was normal in structure. Pulmonic valve  regurgitation is not visualized.  Aorta: The aortic root and ascending aorta are structurally normal, with no evidence of dilitation.  Venous: The inferior vena cava is normal in size with greater than 50% respiratory variability, suggesting right atrial pressure of 3 mmHg.  IAS/Shunts: No atrial level shunt detected by color flow Doppler.   LEFT VENTRICLE PLAX 2D LVIDd:         4.30 cm   Diastology LVIDs:         2.70 cm   LV e' medial:    8.81 cm/s LV PW:         0.90 cm   LV E/e' medial:  10.1 LV IVS:        0.90 cm   LV e' lateral:   10.90 cm/s LVOT diam:     2.10 cm   LV E/e' lateral: 8.1 LV SV:         81 LV SV Index:   44        2D Longitudinal Strain LVOT Area:     3.46 cm  2D Strain GLS (A2C):   -25.8 % 2D Strain GLS (A3C):   -25.9 % 2D Strain GLS (A4C):   -25.5 % 2D Strain GLS Avg:     -25.7 %  3D Volume EF: 3D EF:        70 % LV EDV:       101 ml LV ESV:       31 ml LV SV:        70 ml  RIGHT VENTRICLE RV Basal diam:  3.50 cm RV S prime:     15.50 cm/s TAPSE (M-mode): 2.4 cm  LEFT ATRIUM             Index        RIGHT ATRIUM           Index LA diam:        3.80 cm 2.07 cm/m   RA Area:     10.00 cm LA Vol (A2C):   28.4 ml 15.46 ml/m  RA Volume:   23.00 ml  12.52 ml/m LA Vol (A4C):   23.8 ml 12.96 ml/m LA Biplane Vol: 26.2 ml 14.26 ml/m AORTIC VALVE LVOT Vmax:   113.00 cm/s LVOT Vmean:  76.200 cm/s LVOT VTI:    0.234 m  AORTA Ao Root diam: 3.20 cm Ao  Asc diam:  3.60 cm  MITRAL VALVE               TRICUSPID VALVE MV Area (PHT): 3.27 cm    TR Peak grad:   23.4 mmHg MV Decel Time: 232 msec    TR Vmax:        242.00 cm/s MV E velocity: 88.60 cm/s MV A velocity: 97.90 cm/s  SHUNTS MV E/A ratio:  0.91        Systemic VTI:  0.23 m Systemic Diam: 2.10 cm  Dietrich Pates MD Electronically signed by Dietrich Pates MD Signature Date/Time: 06/25/2021/12:23:49 PM    Final               EKG: 11/30/2022: Normal sinus rhythm 72 no other abnormalities.  Recent Labs: No results found for requested labs within last 365 days.  Recent Lipid Panel No results found for: "CHOL", "TRIG", "HDL", "CHOLHDL", "VLDL", "LDLCALC", "LDLDIRECT"  PHYSICAL EXAM:    VS:  BP 134/80   Pulse 72   Ht 5' 6.5" (1.689 m)   Wt 168 lb (76.2 kg)   LMP  (  LMP Unknown)   SpO2 99%   BMI 26.71 kg/m   BMI: Body mass index is 26.71 kg/m.  GEN: Well nourished, well developed, in no acute distress HEENT: normal Neck: no JVD, carotid bruits, or masses Cardiac: RRR; no murmurs, rubs, or gallops,no edema  Respiratory:  clear to auscultation bilaterally, normal work of breathing GI: soft, nontender, nondistended, + BS MS: no deformity or atrophy Skin: warm and dry, no rash Neuro:  Alert and Oriented x 3, Strength and sensation are intact Psych: euthymic mood, full affect   Wt Readings from Last 3 Encounters:  11/30/22 168 lb (76.2 kg)  05/19/21 161 lb 9.6 oz (73.3 kg)  05/05/20 170 lb (77.1 kg)     ASSESSMENT & PLAN:   Palpitations/PVCs - She is now off the metoprolol and doing quite well asymptomatic.  No changes made.  She can always take metoprolol as needed if necessary.  Family history of WPW - No evidence of preexcitation on ECG.  Hypothyroidism - This has been monitored by primary care doctor.  Mild MR/TR - Overall doing well.  Echocardiogram 2022 as above mild.  Uveitis - Medications as above.  Hyperlipidemia - LDL 173 without atorvastatin.   Continue.  Continue with plant-based diet.      Medication Adjustments/Labs and Tests Ordered: Current medicines are reviewed at length with the patient today.  Concerns regarding medicines are outlined above. Medication changes, Labs and Tests ordered today are summarized above and listed in the Patient Instructions accessible in Encounters.   Signed, Donato Schultz, MD  11/30/2022 3:01 PM    [facility] [address] [place], [place], [place]  [address] Phone: [phone]; Fax: [phone]

## 2022-11-30 NOTE — Patient Instructions (Signed)
Medication Instructions:  The current medical regimen is effective;  continue present plan and medications.  *If you need a refill on your cardiac medications before your next appointment, please call your pharmacy*  Follow-Up: At [facility], you and your health needs are our priority.  As part of our continuing mission to provide you with exceptional heart care, we have created designated Provider Care Teams.  These Care Teams include your primary Cardiologist (physician) and Advanced Practice Providers (APPs -  Physician Assistants and Nurse Practitioners) who all work together to provide you with the care you need, when you need it.  We recommend signing up for the patient portal called "MyChart".  Sign up information is provided on this After Visit Summary.  MyChart is used to connect with patients for Virtual Visits (Telemedicine).  Patients are able to view lab/test results, encounter notes, upcoming appointments, etc.  Non-urgent messages can be sent to your provider as well.   To learn more about what you can do with MyChart, go to [email or web address].    Your next appointment:   1 year(s)  Provider:   Mark Skains, MD      

## 2023-03-02 ENCOUNTER — Other Ambulatory Visit: Payer: Self-pay | Admitting: Family

## 2023-03-02 DIAGNOSIS — N6002 Solitary cyst of left breast: Secondary | ICD-10-CM

## 2023-03-15 ENCOUNTER — Ambulatory Visit: Admission: RE | Admit: 2023-03-15 | Payer: Medicare Other | Source: Ambulatory Visit

## 2023-03-15 ENCOUNTER — Ambulatory Visit
Admission: RE | Admit: 2023-03-15 | Discharge: 2023-03-15 | Disposition: A | Discharge status: Home / Self Care | Payer: Medicare Other | Source: Ambulatory Visit | Attending: Family | Admitting: Family

## 2023-03-15 DIAGNOSIS — N6002 Solitary cyst of left breast: Secondary | ICD-10-CM

## 2023-03-24 ENCOUNTER — Other Ambulatory Visit: Payer: Self-pay | Admitting: Diagnostic Radiology

## 2023-03-24 DIAGNOSIS — R928 Other abnormal and inconclusive findings on diagnostic imaging of breast: Secondary | ICD-10-CM

## 2023-03-30 ENCOUNTER — Other Ambulatory Visit: Payer: Self-pay | Admitting: Family Medicine

## 2023-03-30 DIAGNOSIS — R928 Other abnormal and inconclusive findings on diagnostic imaging of breast: Secondary | ICD-10-CM

## 2023-04-05 ENCOUNTER — Ambulatory Visit
Admission: RE | Admit: 2023-04-05 | Discharge: 2023-04-05 | Disposition: A | Discharge status: Home / Self Care | Payer: Medicare Other | Source: Ambulatory Visit | Attending: Diagnostic Radiology | Admitting: Diagnostic Radiology

## 2023-04-05 DIAGNOSIS — R928 Other abnormal and inconclusive findings on diagnostic imaging of breast: Secondary | ICD-10-CM

## 2023-10-09 ENCOUNTER — Other Ambulatory Visit: Payer: Self-pay | Admitting: Obstetrics and Gynecology

## 2023-10-09 DIAGNOSIS — Z1231 Encounter for screening mammogram for malignant neoplasm of breast: Secondary | ICD-10-CM

## 2023-10-11 ENCOUNTER — Ambulatory Visit
Admission: RE | Admit: 2023-10-11 | Discharge: 2023-10-11 | Disposition: A | Discharge status: Home / Self Care | Source: Ambulatory Visit | Attending: Obstetrics and Gynecology | Admitting: Obstetrics and Gynecology

## 2023-10-11 DIAGNOSIS — Z1231 Encounter for screening mammogram for malignant neoplasm of breast: Secondary | ICD-10-CM

## 2023-12-12 ENCOUNTER — Telehealth: Payer: Self-pay | Admitting: Cardiology

## 2023-12-12 NOTE — Telephone Encounter (Signed)
 Pt c/o swelling/edema: STAT if pt has developed SOB within 24 hours  If swelling, where is the swelling located? Legs/ankles  How much weight have you gained and in what time span? none  Have you gained 2 pounds in a day or 5 pounds in a week? none  Do you have a log of your daily weights (if so, list)? No   Are you currently taking a fluid pill? no  Are you currently SOB? No   Have you traveled recently in a car or plane for an extended period of time? No

## 2023-12-12 NOTE — Telephone Encounter (Signed)
 Spoke with pt, she noticed on Friday that she had swelling in her feet and ankles. She reports her shoes left indention's on her feet. They are not swollen in the morning when she gets up but are swollen by the end of the day. She does not know how long she has had the swelling, she has not paid attention. She denies SOB or other symptoms. She was encouraged to check into support hose or compression stockings to help with edema. Follow up scheduled and Patient voiced understanding to call if symptoms change or get worse.

## 2023-12-30 NOTE — Progress Notes (Signed)
 Cardiology Office Note:    Date:  01/03/2024   ID:  Sherri Villarreal, DOB September 14, 1955, MRN [id number]  PCP:  Katina Pfeiffer, PA-C  Cardiologist:  Oneil Parchment, MD {    Referring MD: Katina Pfeiffer, PA-C   Chief Complaint: lower extremity edema  History of Present Illness:    Sherri Villarreal is a 68 y.o. 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 Parchment and presents today for evaluation of lower extremity edema.   Patient has been followed by Dr. Parchment since at least 2015 for palpitations with prior Holter monitor showing 7,000 PVCs. She was started on a beta-blocker after this and has done well with this. Last Echo in 06/2021 showed LVEF of 65-70% with normal wall motion and diastolic function, normal RV function, and mild MR.   She was last seen by Dr. Parchment in 11/2022 at which time she was doing well. She had been off Metoprolol  for the last 6 months but had done well with this with no significant palpitations.  Patient called our office on 12/12/2023 with concerns about lower extremity. Therefore, this visit scheduled for further evaluation.  She reports that she first started to notice bilateral lower extremity edema about 3 weeks ago.  This goes down with elevation of her legs at night but then gets progressively worse throughout the day again.  She states her legs feel heavy.  She has no other CHF symptoms.  No shortness of breath, orthopnea, PND, or weight gain.  No chest pain.  She has occasional palpitations that she describes as fluttering but nothing significant.  No lightheadedness, dizziness, syncope.  EKGs/Labs/Other Studies Reviewed:    The following studies were reviewed:  Echocardiogram 06/25/2021: Impressions: 1. Global longitudinal strain is -25.7%. Left ventricular ejection  fraction, by  estimation, is 65 to 70%. The left ventricle has normal  function. The left ventricle has no regional wall motion abnormalities.  Left ventricular diastolic parameters were  normal.   2. Right ventricular systolic function is normal. The right ventricular  size is normal.   3. The mitral valve is normal in structure. Mild mitral valve  regurgitation.   4. The aortic valve is tricuspid. Aortic valve regurgitation is not  visualized. Aortic valve sclerosis is present, with no evidence of aortic  valve stenosis.   5. The inferior vena cava is normal in size with greater than 50%  respiratory variability, suggesting right atrial pressure of 3 mmHg.    EKG:  EKG ordered today.   EKG Interpretation Date/Time:  Wednesday January 03 2024 10:07:09 EDT Ventricular Rate:  78 PR Interval:  164 QRS Duration:  72 QT Interval:  360 QTC Calculation: 410 R Axis:   12  Text Interpretation: Normal sinus rhythm Possible Left atrial enlargement When compared with ECG of 27-Jul-2016 09:21, No significant change was found Confirmed by Jadine Patient [phone]) on 01/03/2024 10:11:03 AM    Recent Labs: No results found for requested labs within last 365 days.  Recent Lipid Panel No results found for: CHOL, TRIG, HDL, CHOLHDL, VLDL, LDLCALC, LDLDIRECT  Physical Exam:    Vital Signs: BP (!) 142/78   Pulse 78   Ht 5' 6.5 (1.689 m)   Wt 165 lb (74.8 kg)   LMP  (LMP Unknown)   SpO2 96%   BMI 26.23 kg/m     Wt  Readings from Last 3 Encounters:  01/03/24 165 lb (74.8 kg)  11/30/22 168 lb (76.2 kg)  05/19/21 161 lb 9.6 oz (73.3 kg)     General: 68 y.o. Caucasian female in no acute distress. HEENT: Normocephalic and atraumatic. Sclera clear.  Neck: Supple. No carotid bruits. No JVD. Heart: RRR. Distinct S1 and S2. No murmurs, gallops, or rubs.  Lungs: No increased work of breathing. Clear to ausculation bilaterally. No wheezes, rhonchi, or rales.  Extremities: No lower extremity  edema.   Skin: Warm and dry. Neuro: No focal deficits. Psych: Normal affect. Responds appropriately.  Assessment:    1. Lower extremity edema   2. Palpitations   3. PVC's (premature ventricular contractions)   4. Elevated BP without diagnosis of hypertension   5. Mitral valve insufficiency, unspecified etiology   6. Hyperlipidemia, unspecified hyperlipidemia type     Plan:    Lower Extremity Edema Patient reports bilateral lower extremity edema for the last 3 weeks that is worse at the end of the day and improves with elevation of her legs overnight.   - No significant edema on exam this morning.  - Suspect she has some chronic venous insufficiency. She has no other signs or symptoms of CHF. Discussed starting conservative management alone vs low-dose daily HCTZ or PRN Lasix.  She states she has always been self-consciousness about her legs and would prefer a medication to help with this if possible. Therefore, will have her take Lasix 20 mg for the next 3 days and then as needed for worsening edema.  Also discussed conservative management such as wearing compression stockings, elevating legs is much as possible, and limiting salt intake. - Advised patient to let us  know if she is needing this frequently so that we can recheck her renal function/electrolytes.  Recent CMET in Care Everywhere on 11/28/2023 showed creatinine of 0.72 and potassium of 4.7.  Palpitations Frequent PVCs Patient has a long history of palpitations with frequent PVCs noted on remote monitor.  - Stable. No significant palpitations.  - Previously on Metoprolol  but has been off of this for over a years and is doing well.   Elevated BP without Diagnosis of Hypertension BP elevated in the office today. Initially 168/90 and then 142/78 on my personal recheck at the end of visit. However, she states BP is usually well controlled at home and is only elevated when she comes to a medical visit. BP usually in the 120-130s/  60-70s at home. - Advised patient to let us  know if BP consistently >130/80 at home. If this happens, could stop PRN Lasix and start HCTZ 12.5mg  daily.  Mild Mitral Regurgitation Noted on Echo in 06/2021.  - Will repeat Echo in 06/2024 for routine monitoring of this.  Hyperlipidemia Lipid panel in 01/2023: Total Cholesterol 186, Triglycerides 54, HDL 105, LDL 70. - Continue Lipitor 10mg  daily.   Disposition: Follow up in 6 months after Echo.   Signed, Barkley Kratochvil E Ryot Burrous, PA-C  01/03/2024 11:06 AM    [facility]

## 2024-01-03 ENCOUNTER — Ambulatory Visit: Attending: Cardiology | Admitting: Student

## 2024-01-03 ENCOUNTER — Encounter: Payer: Self-pay | Admitting: Student

## 2024-01-03 VITALS — BP 142/78 | HR 78 | Ht 66.5 in | Wt 165.0 lb

## 2024-01-03 DIAGNOSIS — R030 Elevated blood-pressure reading, without diagnosis of hypertension: Secondary | ICD-10-CM | POA: Diagnosis present

## 2024-01-03 DIAGNOSIS — I340 Nonrheumatic mitral (valve) insufficiency: Secondary | ICD-10-CM | POA: Diagnosis present

## 2024-01-03 DIAGNOSIS — E785 Hyperlipidemia, unspecified: Secondary | ICD-10-CM | POA: Insufficient documentation

## 2024-01-03 DIAGNOSIS — I493 Ventricular premature depolarization: Secondary | ICD-10-CM | POA: Insufficient documentation

## 2024-01-03 DIAGNOSIS — R600 Localized edema: Secondary | ICD-10-CM | POA: Insufficient documentation

## 2024-01-03 DIAGNOSIS — R002 Palpitations: Secondary | ICD-10-CM | POA: Insufficient documentation

## 2024-01-03 MED ORDER — FUROSEMIDE 20 MG PO TABS: ORAL_TABLET | ORAL | 1 refills | Status: AC

## 2024-01-03 NOTE — Patient Instructions (Signed)
 Medication Instructions:  Your physician has recommended you make the following change in your medication:   START Lasix 20 mg taking 1 daily for 3 days, then only as needed..  IF YOU NOTICE YOU ARE NEEDING IT MORE OFTEN THEN NOT, WE WILL NEED TO KEEP A EYE OUT ON YOUR KIDNEY FUNCTION, SO PLEASE LET US  KNOW  *If you need a refill on your cardiac medications before your next appointment, please call your pharmacy*  Lab Work: None ordered  If you have labs (blood work) drawn today and your tests are completely normal, you will receive your results only by: MyChart Message (if you have MyChart) OR A paper copy in the mail If you have any lab test that is abnormal or we need to change your treatment, we will call you to review the results.  Testing/Procedures: Your physician has requested that you have an echocardiogram IN [place]. Echocardiography is a painless test that uses sound waves to create images of your heart. It provides your doctor with information about the size and shape of your heart and how well your heart's chambers and valves are working. This procedure takes approximately one hour. There are no restrictions for this procedure. Please do NOT wear cologne, perfume, aftershave, or lotions (deodorant is allowed). Please arrive 15 minutes prior to your appointment time.  Please note: We ask at that you not bring children with you during ultrasound (echo/ vascular) testing. Due to room size and safety concerns, children are not allowed in the ultrasound rooms during exams. Our front office staff cannot provide observation of children in our lobby area while testing is being conducted. An adult accompanying a patient to their appointment will only be allowed in the ultrasound room at the discretion of the ultrasound technician under special circumstances. We apologize for any inconvenience.   Follow-Up: At [facility], you and your health needs are our priority.  As part of  our continuing mission to provide you with exceptional heart care, our providers are all part of one team.  This team includes your primary Cardiologist (physician) and Advanced Practice Providers or APPs (Physician Assistants and Nurse Practitioners) who all work together to provide you with the care you need, when you need it.  Your next appointment:   DECEMBER AFTER THE ECHO  Provider:   Callie Goodrich, PA-C          We recommend signing up for the patient portal called MyChart.  Sign up information is provided on this After Visit Summary.  MyChart is used to connect with patients for Virtual Visits (Telemedicine).  Patients are able to view lab/test results, encounter notes, upcoming appointments, etc.  Non-urgent messages can be sent to your provider as well.   To learn more about what you can do with MyChart, go to [email or web address].   Other Instructions Let us  know if your blood pressure consistently runs higher than 130/80

## 2024-06-24 ENCOUNTER — Ambulatory Visit (HOSPITAL_COMMUNITY)
Admission: RE | Admit: 2024-06-24 | Discharge: 2024-06-24 | Disposition: A | Source: Ambulatory Visit | Attending: Student

## 2024-06-24 DIAGNOSIS — R600 Localized edema: Secondary | ICD-10-CM | POA: Diagnosis not present

## 2024-06-24 DIAGNOSIS — R002 Palpitations: Secondary | ICD-10-CM

## 2024-06-24 DIAGNOSIS — E785 Hyperlipidemia, unspecified: Secondary | ICD-10-CM

## 2024-06-24 DIAGNOSIS — I340 Nonrheumatic mitral (valve) insufficiency: Secondary | ICD-10-CM | POA: Diagnosis not present

## 2024-06-24 DIAGNOSIS — I493 Ventricular premature depolarization: Secondary | ICD-10-CM

## 2024-06-24 LAB — ECHOCARDIOGRAM COMPLETE
Area-P 1/2: 4.31 cm2
S' Lateral: 2.5 cm

## 2024-06-25 ENCOUNTER — Ambulatory Visit: Payer: Self-pay | Admitting: Student
